# Patient Record
Sex: Male | Born: 1985
Health system: Southern US, Community
[De-identification: ages and names within clinical notes are randomized; demographics above are authoritative.]

## PROBLEM LIST (undated history)

## (undated) DIAGNOSIS — F32A Depression, unspecified: Secondary | ICD-10-CM

## (undated) DIAGNOSIS — F909 Attention-deficit hyperactivity disorder, unspecified type: Secondary | ICD-10-CM

## (undated) DIAGNOSIS — G43909 Migraine, unspecified, not intractable, without status migrainosus: Secondary | ICD-10-CM

## (undated) DIAGNOSIS — J9819 Other pulmonary collapse: Secondary | ICD-10-CM

## (undated) DIAGNOSIS — B019 Varicella without complication: Secondary | ICD-10-CM

## (undated) DIAGNOSIS — F329 Major depressive disorder, single episode, unspecified: Secondary | ICD-10-CM

## (undated) DIAGNOSIS — J050 Acute obstructive laryngitis [croup]: Secondary | ICD-10-CM

## (undated) DIAGNOSIS — F419 Anxiety disorder, unspecified: Secondary | ICD-10-CM

## (undated) HISTORY — DX: Other pulmonary collapse: J98.19

## (undated) HISTORY — DX: Varicella without complication: B01.9

---
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
## 2005-01-03 ENCOUNTER — Encounter: Admission: RE | Admit: 2005-01-03 | Discharge: 2005-01-03 | Payer: Self-pay | Admitting: Specialist

## 2006-04-18 ENCOUNTER — Encounter: Admission: RE | Admit: 2006-04-18 | Discharge: 2006-04-18 | Payer: Self-pay | Admitting: Specialist

## 2013-06-10 DIAGNOSIS — J9819 Other pulmonary collapse: Secondary | ICD-10-CM

## 2013-06-10 HISTORY — DX: Other pulmonary collapse: J98.19

## 2013-06-10 HISTORY — PX: CHEST TUBE INSERTION: SHX231

## 2013-07-16 ENCOUNTER — Encounter (HOSPITAL_COMMUNITY): Payer: Self-pay | Admitting: Emergency Medicine

## 2013-07-16 ENCOUNTER — Inpatient Hospital Stay (HOSPITAL_COMMUNITY)
Admission: EM | Admit: 2013-07-16 | Discharge: 2013-07-19 | DRG: 201 | Disposition: A | Discharge status: Home / Self Care | Payer: BC Managed Care – PPO | Attending: Cardiothoracic Surgery | Admitting: Cardiothoracic Surgery

## 2013-07-16 ENCOUNTER — Emergency Department (HOSPITAL_COMMUNITY): Payer: BC Managed Care – PPO

## 2013-07-16 DIAGNOSIS — J9383 Other pneumothorax: Secondary | ICD-10-CM

## 2013-07-16 DIAGNOSIS — F3289 Other specified depressive episodes: Secondary | ICD-10-CM | POA: Diagnosis present

## 2013-07-16 DIAGNOSIS — F909 Attention-deficit hyperactivity disorder, unspecified type: Secondary | ICD-10-CM | POA: Diagnosis present

## 2013-07-16 DIAGNOSIS — Z87891 Personal history of nicotine dependence: Secondary | ICD-10-CM

## 2013-07-16 DIAGNOSIS — F411 Generalized anxiety disorder: Secondary | ICD-10-CM | POA: Diagnosis present

## 2013-07-16 DIAGNOSIS — F329 Major depressive disorder, single episode, unspecified: Secondary | ICD-10-CM | POA: Diagnosis present

## 2013-07-16 HISTORY — DX: Major depressive disorder, single episode, unspecified: F32.9

## 2013-07-16 HISTORY — DX: Attention-deficit hyperactivity disorder, unspecified type: F90.9

## 2013-07-16 HISTORY — DX: Acute obstructive laryngitis (croup): J05.0

## 2013-07-16 HISTORY — DX: Migraine, unspecified, not intractable, without status migrainosus: G43.909

## 2013-07-16 HISTORY — DX: Depression, unspecified: F32.A

## 2013-07-16 HISTORY — DX: Anxiety disorder, unspecified: F41.9

## 2013-07-16 LAB — CBC WITH DIFFERENTIAL/PLATELET
BASOS PCT: 0 % (ref 0–1)
Basophils Absolute: 0 10*3/uL (ref 0.0–0.1)
Eosinophils Absolute: 0.1 10*3/uL (ref 0.0–0.7)
Eosinophils Relative: 1 % (ref 0–5)
HEMATOCRIT: 42 % (ref 39.0–52.0)
Hemoglobin: 15 g/dL (ref 13.0–17.0)
LYMPHS PCT: 21 % (ref 12–46)
Lymphs Abs: 2.1 10*3/uL (ref 0.7–4.0)
MCH: 29.8 pg (ref 26.0–34.0)
MCHC: 35.7 g/dL (ref 30.0–36.0)
MCV: 83.3 fL (ref 78.0–100.0)
MONO ABS: 0.8 10*3/uL (ref 0.1–1.0)
MONOS PCT: 8 % (ref 3–12)
Neutro Abs: 7 10*3/uL (ref 1.7–7.7)
Neutrophils Relative %: 71 % (ref 43–77)
Platelets: 341 10*3/uL (ref 150–400)
RBC: 5.04 MIL/uL (ref 4.22–5.81)
RDW: 12.5 % (ref 11.5–15.5)
WBC: 9.9 10*3/uL (ref 4.0–10.5)

## 2013-07-16 LAB — BASIC METABOLIC PANEL
BUN: 13 mg/dL (ref 6–23)
CALCIUM: 9.5 mg/dL (ref 8.4–10.5)
CHLORIDE: 106 meq/L (ref 96–112)
CO2: 18 meq/L — AB (ref 19–32)
CREATININE: 0.86 mg/dL (ref 0.50–1.35)
GFR calc Af Amer: >90 mL/min (ref >90)
GFR calc non Af Amer: >90 mL/min (ref >90)
Glucose, Bld: 111 mg/dL — ABNORMAL HIGH (ref 70–99)
Potassium: 3.5 mEq/L — ABNORMAL LOW (ref 3.7–5.3)
Sodium: 144 mEq/L (ref 137–147)

## 2013-07-16 MED ORDER — OXYCODONE HCL 5 MG PO TABS
5.0000 mg | ORAL_TABLET | ORAL | Status: DC | PRN
  Administered 2013-07-17 – 2013-07-19 (×5): 5 mg via ORAL
  Filled 2013-07-16 (×5): qty 1

## 2013-07-16 MED ORDER — DOCUSATE SODIUM 100 MG PO CAPS
100.0000 mg | ORAL_CAPSULE | Freq: Two times a day (BID) | ORAL | Status: DC
  Administered 2013-07-16 – 2013-07-19 (×6): 100 mg via ORAL
  Filled 2013-07-16 (×8): qty 1

## 2013-07-16 MED ORDER — ENOXAPARIN SODIUM 40 MG/0.4ML ~~LOC~~ SOLN
40.0000 mg | SUBCUTANEOUS | Status: DC
  Administered 2013-07-16 – 2013-07-18 (×3): 40 mg via SUBCUTANEOUS
  Filled 2013-07-16 (×4): qty 0.4

## 2013-07-16 MED ORDER — ALUM & MAG HYDROXIDE-SIMETH 200-200-20 MG/5ML PO SUSP: 30.0000 mL | Freq: Four times a day (QID) | ORAL | Status: DC | PRN

## 2013-07-16 MED ORDER — LIDOCAINE-EPINEPHRINE 2 %-1:100000 IJ SOLN
20.0000 mL | INTRAMUSCULAR | Status: AC
  Filled 2013-07-16: qty 20

## 2013-07-16 MED ORDER — SODIUM CHLORIDE 0.9 % IV SOLN
Freq: Once | INTRAVENOUS | Status: AC
  Administered 2013-07-16: 17:00:00 via INTRAVENOUS

## 2013-07-16 MED ORDER — SODIUM CHLORIDE 0.9 % IJ SOLN
3.0000 mL | Freq: Two times a day (BID) | INTRAMUSCULAR | Status: DC
  Administered 2013-07-16 – 2013-07-18 (×2): 3 mL via INTRAVENOUS

## 2013-07-16 MED ORDER — MIDAZOLAM HCL 2 MG/2ML IJ SOLN
4.0000 mg | Freq: Once | INTRAMUSCULAR | Status: AC
  Administered 2013-07-16: 4 mg via INTRAVENOUS
  Filled 2013-07-16: qty 4

## 2013-07-16 MED ORDER — POTASSIUM CHLORIDE IN NACL 20-0.45 MEQ/L-% IV SOLN
INTRAVENOUS | Status: DC
Start: 2013-07-16 — End: 2013-07-18
  Administered 2013-07-16 – 2013-07-17 (×2): via INTRAVENOUS
  Filled 2013-07-16 (×4): qty 1000

## 2013-07-16 MED ORDER — ACETAMINOPHEN 325 MG PO TABS
650.0000 mg | ORAL_TABLET | Freq: Four times a day (QID) | ORAL | Status: DC | PRN
  Administered 2013-07-17: 650 mg via ORAL
  Filled 2013-07-16: qty 2

## 2013-07-16 MED ORDER — FENTANYL CITRATE 0.05 MG/ML IJ SOLN
100.0000 ug | Freq: Once | INTRAMUSCULAR | Status: AC
  Administered 2013-07-16: 100 ug via INTRAVENOUS
  Filled 2013-07-16: qty 2

## 2013-07-16 MED ORDER — ACETAMINOPHEN 650 MG RE SUPP: 650.0000 mg | Freq: Four times a day (QID) | RECTAL | Status: DC | PRN

## 2013-07-16 MED ORDER — ONDANSETRON HCL 4 MG PO TABS
4.0000 mg | ORAL_TABLET | Freq: Four times a day (QID) | ORAL | Status: DC | PRN
  Administered 2013-07-17 – 2013-07-18 (×3): 4 mg via ORAL
  Filled 2013-07-16 (×3): qty 1

## 2013-07-16 MED ORDER — HYDROMORPHONE HCL PF 1 MG/ML IJ SOLN
1.0000 mg | INTRAMUSCULAR | Status: DC | PRN
  Administered 2013-07-16 – 2013-07-18 (×5): 1 mg via INTRAVENOUS
  Filled 2013-07-16 (×6): qty 1

## 2013-07-16 MED ORDER — ONDANSETRON HCL 4 MG/2ML IJ SOLN
4.0000 mg | Freq: Four times a day (QID) | INTRAMUSCULAR | Status: DC | PRN
  Administered 2013-07-17: 4 mg via INTRAVENOUS
  Filled 2013-07-16: qty 2

## 2013-07-16 MED ORDER — GUAIFENESIN-DM 100-10 MG/5ML PO SYRP: 5.0000 mL | ORAL_SOLUTION | ORAL | Status: DC | PRN

## 2013-07-16 NOTE — ED Notes (Signed)
Portable chest at bedside

## 2013-07-16 NOTE — Consult Note (Signed)
[address]       [place] [address]             [phone]        [facility] Medical Record #[id number] Date of Birth: January 10, 1986  Referring: No ref. provider found Primary Care: Aura Dials, MD  Chief Complaint:    Chief Complaint  Patient presents with  . Shortness of Breath   patient examined, chest radiographs reviewed  History of Present Illness:     28 year old Caucasian male reformed smoker presents with 36 hours of chest pain and shortness of breath. His primary physician ordered a chest x-ray which showed a 90% left pneumothorax, he wa s sent to the [facility]. Thoracic surgery was consulted.  The patient denies prior episode of spontaneous pneumothorax. He denies any recent traumatic episodes, violent coughing, or history of chronic lung disease or asthma. He is always been very thin.  After the ED evaluation I discussed left chest tube which was subsequently performed in the ED. The chest tube successfully reinflated the left lung and the patient is being admitted   Current Activity/ Functional Status: Works full time at [employer], lives with wife and children    Zubrod Score: At the time of surgery this patient's most appropriate activity status/level should be described as: []     0    Normal activity, no symptoms [x]     1    Restricted in physical strenuous activity but ambulatory, able to do out light work []     2    Ambulatory and capable of self care, unable to do work activities, up and about                 more than 50%  Of the time                            []     3    Only limited self care, in bed greater than 50% of waking hours []     4    Completely disabled, no self care, confined to bed or chair []     5    Moribund  Past Medical History  Diagnosis Date  . Depression   . Migraine   . Croup   . Anxiety   . ADHD (attention deficit hyperactivity disorder)     History reviewed. No pertinent past surgical  history.  History  Smoking status  . Former Smoker  Smokeless tobacco  . Not on file   History  Alcohol Use  . Yes    Comment: occasional    History   Social History  . Marital Status: Married    Spouse Name: N/A    Number of Children: N/A  . Years of Education: N/A   Occupational History  . Not on file.   Social History Main Topics  . Smoking status: Former Games developer  . Smokeless tobacco: Not on file  . Alcohol Use: Yes     Comment: occasional  . Drug Use: No     Comment: former user of drugs  . Sexual Activity: Not on file   Other Topics Concern  . Not on file   Social History Narrative  . No narrative on file    Allergies  Allergen Reactions  . Adderall [Amphetamine-Dextroamphetamine]     Current Facility-Administered Medications  Medication Dose Route Frequency Provider Last Rate Last Dose  .  0.45 % NaCl with KCl 20 mEq / L infusion   Intravenous Continuous Kerin PernaPeter Van Trigt, MD      . acetaminophen (TYLENOL) tablet 650 mg  650 mg Oral Q6H PRN Kerin PernaPeter Van Trigt, MD       Or  . acetaminophen (TYLENOL) suppository 650 mg  650 mg Rectal Q6H PRN Kerin PernaPeter Van Trigt, MD      . alum & mag hydroxide-simeth (MAALOX/MYLANTA) 200-200-20 MG/5ML suspension 30 mL  30 mL Oral Q6H PRN Kerin PernaPeter Van Trigt, MD      . docusate sodium (COLACE) capsule 100 mg  100 mg Oral BID Kerin PernaPeter Van Trigt, MD      . enoxaparin (LOVENOX) injection 40 mg  40 mg Subcutaneous Q24H Kerin PernaPeter Van Trigt, MD      . guaiFENesin-dextromethorphan [facility](ROBITUSSIN DM) 100-10 MG/5ML syrup 5 mL  5 mL Oral Q4H PRN Kerin PernaPeter Van Trigt, MD      . HYDROmorphone (DILAUDID) injection 1 mg  1 mg Intravenous Q2H PRN Kerin PernaPeter Van Trigt, MD      . lidocaine-EPINEPHrine (XYLOCAINE W/EPI) 2 %-1:100000 (with pres) injection 20 mL  20 mL Intradermal To ER Margie BilletMathias Allen, MD      . ondansetron [facility](ZOFRAN) tablet 4 mg  4 mg Oral Q6H PRN Kerin PernaPeter Van Trigt, MD       Or  . ondansetron [facility](ZOFRAN) injection 4 mg  4 mg Intravenous Q6H PRN Kerin PernaPeter Van Trigt, MD       . oxyCODONE (Oxy IR/ROXICODONE) immediate release tablet 5 mg  5 mg Oral Q3H PRN Kerin PernaPeter Van Trigt, MD      . sodium chloride 0.9 % injection 3 mL  3 mL Intravenous Q12H Kerin PernaPeter Van Trigt, MD       No current outpatient prescriptions on file.     (Not in a hospital admission)  History reviewed. No pertinent family history.   Review of Systems:    no prior spontaneous pneumothorax No history of chronic lung disease No preceding episodes of chest trauma or vigorous exercise Patient is right-hand dominant    Cardiac Review of Systems: Y or N  Chest Pain [ yes   ]  Resting SOB [  Y. ES  ] Exertional SOB  [ Y. ES ]  Orthopnea [ Y. ES  ]   Pedal Edema [  no  ]    Palpitations [ yes  ] Syncope  in[  ]   Presyncope [N.   ]  General Review of Systems: [Y] = yes [  ]=no Constitional: recent weight change [  ]; anorexia [  ]; fatigue [  ]; nausea [  ]; night sweats [  ]; fever [  ]; or chills [  ]                                                               Dental: poor dentition[  ]; Last Dentist visit:  one year   Eye : blurred vision [  ]; diplopia [   ]; vision changes [  ];  Amaurosis fugax[  ]; Resp: cough [  ];  wheezing[  ];  hemoptysis[  ]; shortness of breath[  ]; paroxysmal nocturnal dyspnea[  ]; dyspnea on exertion[  ]; or orthopnea[  ];  GI:  gallstones[  ],  vomiting[  ];  dysphagia[  ]; melena[  ];  hematochezia [  ]; heartburn[  ];   Hx of  Colonoscopy[  ]; GU: kidney stones [  ]; hematuria[  ];   dysuria [  ];  nocturia[  ];  history of     obstruction [  ]; urinary frequency [  ]             Skin: rash, swelling[  ];, hair loss[  ];  peripheral edema[  ];  or itching[  ]; Musculosketetal: myalgias[  ];  joint swelling[  ];  joint erythema[  ]; history left shoulder pain and left shoulder injection   joint pain[  ];  back pain[  ];  Heme/Lymph: bruising[  ];  bleeding[  ];  anemia[  ];  Neuro: TIA[  ];  headaches[  ];  stroke[  ];  vertigo[  ];  seizures[  ];   paresthesias[  ];   difficulty walking[  ];  Psych:depression[  ]; anxiety[ yes  ];  Endocrine: diabetes[  ];  thyroid dysfunction[  ];  Immunizations: Flu [  ]; Pneumococcal[  ];  Other:  Physical Exam: BP 120/88  Pulse 130  Temp(Src) 97.7 F (36.5 C) (Oral)  Resp 24  SpO2 96%  Exam    anxious young Caucasian male in the ED with mild respiratory distress on oxygen   HEENT normocephalic no crepitus in the neck good dentition Thorax -no deformity or tenderness, absent breath sounds on left breath sounds clear on right Cardiac-sinus tachycardia no murmur Abdomen-scaphoid nontender Extremities-no edema clubbing or tenderness no cyanosis Neuro-no focal more deficit   Diagnostic Studies & Laboratory data:    large left pneumothorax   Recent Radiology Findings:   Dg Chest Portable 1 View  07/16/2013   CLINICAL DATA:  Known large left pneumothorax  EXAM: PORTABLE CHEST - 1 VIEW  COMPARISON:  PA and lateral chest x-ray of today's date.  FINDINGS: There is a total pneumothorax on the left. Collapsed lung parenchyma seen in the infrahilar and lower paravertebral region. The right lung is well-expanded and clear. The cardiac silhouette is not enlarged. The pulmonary vascularity is not engorged. The observed portions of the bony thorax appear normal. There is no pleural effusion.  IMPRESSION: There is incomplete pneumothorax on the left.  These results were called by telephone at the time of interpretation on 07/16/2013 at 4:28 PM to Dr. Nelva Nay , who verbally acknowledged these results.   Electronically Signed   By: David  [place]   On: 07/16/2013 16:29      Recent Lab Findings: Lab Results  Component Value Date   WBC 9.9 07/16/2013   HGB 15.0 07/16/2013   HCT 42.0 07/16/2013   PLT 341 07/16/2013   GLUCOSE 111* 07/16/2013   NA 144 07/16/2013   K 3.5* 07/16/2013   CL 106 07/16/2013   CREATININE 0.86 07/16/2013   BUN 13 07/16/2013   CO2 18* 07/16/2013      Assessment / Plan:     Patient was prepared for and received  a left chest tube-20 [place], which reexpanded his left lung. Patient readmitted to 2 W. telemetry bed for chest tube suction management of his spontaneous pneumothorax.       @ME1 @ 07/16/2013 5:57 PM

## 2013-07-16 NOTE — ED Notes (Signed)
Per EMS: pt coughed last night and felt a pop in his chest. Pt then went to an urgent care this afternoon with chest tightness and they did a chest x-ray and said that his left lung was completely collapsed. Pt was 95% NRB, with some anxiety and given 4mg  of zofran for car sickness on the way over.

## 2013-07-16 NOTE — Op Note (Signed)
Procedure-left chest tube placement (20 [place]French) preop postoperative diagnosis-90% left pneumothorax, spontaneous  Surgeon Kathlee NationsPeter Van trigt M.D.  Anesthesia local 1% lidocaine with IV conscious sedation, monitored  Clinical note The patient is a thin 28 year old Caucasian male presented with 36 hours of chest pain and shortness of breath. Chest x-ray demonstrated a 90% left pneumothorax and he was having mild respiratory distress in the emergency department. Informed consent for left chest tube placement was obtained after discussing the indications benefits and risks with the patient and his wife.  Procedure A proper timeout was performed. The left chest was prepped and draped as a sterile field. 1% local lidocaine was infiltrated in the anterior axillary line at the fifth interspace A small incision was made, further lidocaine was infiltrated into the intercostal muscle A 20 French chest tube was then inserted and directed to the apex. A  blast of air exited the left chest with insertion of the tube. The tube was connected to a underwater seal Pleur-evac drainage system. The chest tube was secured to the skin with 2 silk sutures. Sterile dressings applied.  Subsequent chest x-ray showed the chest to be in good position with reexpansion of the left lung and with no airleak preparations for admission to the hospital are made.

## 2013-07-16 NOTE — ED Provider Notes (Signed)
CSN: [id number]     Arrival date & time 07/16/13  1546 History   First MD Initiated Contact with Patient 07/16/13 1553     Chief Complaint  Patient presents with  . Shortness of Breath    HPI: Sean Carroll is a 28 yo M with history of depression and migraines who presents with chest pain and SOB. Symptoms started last night, he felt as if he needed to cough, when he did he had sharp pain in the left side of his chest. He had mild SOB as well. Today his shortness of breath worsened, his chest felt tight so he went to his PCP, who sent him to urgent care where a chest x-ray showed PTX. He was sent to the ED via EMS. On arrival he complains of SOB and left sided chest pain, described as tightness, radiates to his left shoulder, worse with deep inspiration, no relieving factors. He is noted to be tachycardic with O2 sats of 95% on NRB. He has no history of PTX. Previous smoker but quite last year.    Past Medical History  Diagnosis Date  . Depression   . Migraine   . Croup    History reviewed. No pertinent past surgical history. History reviewed. No pertinent family history. History  Substance Use Topics  . Smoking status: Former Games developer  . Smokeless tobacco: Not on file  . Alcohol Use: Yes     Comment: occasional    Review of Systems  Constitutional: Negative for fever, chills, appetite change and fatigue.  Eyes: Negative for photophobia and visual disturbance.  Respiratory: Positive for cough and shortness of breath.   Cardiovascular: Positive for chest pain.  Gastrointestinal: Positive for nausea (in the ambulance, resolved in ED). Negative for vomiting, abdominal pain and constipation.  Genitourinary: Negative for dysuria, frequency and decreased urine volume.  Musculoskeletal: Negative for arthralgias, back pain, gait problem and myalgias.  Skin: Negative for color change and wound.  Neurological: Negative for dizziness, syncope, light-headedness and headaches.   Psychiatric/Behavioral: Negative for confusion and agitation.  All other systems reviewed and are negative.    Allergies  Adderall  Home Medications  No current outpatient prescriptions on file. BP 116/91  Temp(Src) 97.7 F (36.5 C) (Oral)  Resp 13  SpO2 96% Physical Exam  Nursing note and vitals reviewed. Constitutional: He is oriented to person, place, and time. He appears well-developed and well-nourished. No distress.  HENT:  Head: Normocephalic and atraumatic.  Mouth/Throat: Oropharynx is clear and moist.  Eyes: Conjunctivae and EOM are normal. Pupils are equal, round, and reactive to light.  Neck: Normal range of motion. Neck supple.  Cardiovascular: Regular rhythm, normal heart sounds and intact distal pulses.  Tachycardia present.   Pulmonary/Chest: Tachypnea noted. No respiratory distress. He has decreased breath sounds (over majority of left lung).  Abdominal: Soft. Bowel sounds are normal. There is no tenderness. There is no rebound and no guarding.  Musculoskeletal: Normal range of motion. He exhibits no edema and no tenderness.  Neurological: He is alert and oriented to person, place, and time. No cranial nerve deficit. Coordination normal.  Skin: Skin is warm and dry. No rash noted.  Psychiatric: His mood appears anxious.    ED Course  Procedures (including critical care time) Labs Review Labs Reviewed  BASIC METABOLIC PANEL - Abnormal; Notable for the following:    Potassium 3.5 (*)    CO2 18 (*)    Glucose, Bld 111 (*)    All other components within normal  limits  CBC WITH DIFFERENTIAL  COMPREHENSIVE METABOLIC PANEL  CBC   Imaging Review Dg Chest Port 1 View  07/16/2013   CLINICAL DATA:  Chest tube insertion for a large left pneumothorax.  EXAM: PORTABLE CHEST - 1 VIEW  COMPARISON:  Earlier today.  FINDINGS: Interval left chest tube with its tip at the left lung apex. The previously seen large left pneumothorax has resolved. Minimal linear density in  the left upper lobe. Clear right lung. Normal sized heart. Unremarkable bones.  IMPRESSION: Resolved large left pneumothorax following chest tube placement.   Electronically Signed   By: Gordan PaymentSteve  Reid M.D.   On: 07/16/2013 17:55   Dg Chest Portable 1 View  07/16/2013   CLINICAL DATA:  Known large left pneumothorax  EXAM: PORTABLE CHEST - 1 VIEW  COMPARISON:  PA and lateral chest x-ray of today's date.  FINDINGS: There is a total pneumothorax on the left. Collapsed lung parenchyma seen in the infrahilar and lower paravertebral region. The right lung is well-expanded and clear. The cardiac silhouette is not enlarged. The pulmonary vascularity is not engorged. The observed portions of the bony thorax appear normal. There is no pleural effusion.  IMPRESSION: There is incomplete pneumothorax on the left.  These results were called by telephone at the time of interpretation on 07/16/2013 at 4:28 PM to Dr. Nelva NayOBERT BEATON , who verbally acknowledged these results.   Electronically Signed   By: David  [place]   On: 07/16/2013 16:29    EKG Interpretation   Time and Date: 06/15/2012 1557 Rate: 126 PR: 126 QRS: 76 QT: 334 QTc: 484 R: 76 Interpretation:  Sinus tachycardia with prolonged QTc, likely rate related. Poor R wave progression. No previous ECG for comparison      MDM    28 year old male with no pertinent history who presents for further evaluation of spontaneous pneumothorax that was diagnosed at an urgent care center just prior to arrival.  He is hypoxic, oxygen saturation's 95% on NRB, also noted to be tachycardic into the 130s. ECG show sinus rhythm without ischemic changes. Repeat chest x-ray shows complete left-sided pneumothorax. His trachea is midline and there is no mediastinal shift to suggest tension. CT surgery was consulted and they placed a left-sided chest tube. Repeat chest x-ray shows resolution of the large pneumothorax. His symptoms markedly improved following chest tube insertion. He was  maintained on nasal cannula. Following chest tube insertion he was admitted to the cardiothoracic surgery service. He remained HD stable. The patient and his family were updated on the plan and they were in agreement  Reviewed imaging, labs, ECG and previous medical records, utilized in make decision-making  Discuss case with Dr. Radford PaxBeaton  Clinical impression 1. Left-sided pneumothorax     Sean BilletMathias Britney Newstrom, MD 07/17/13 [phone]

## 2013-07-16 NOTE — ED Notes (Signed)
Pt remains alert and talkative throughout the procedure. Pt cooperative and following commands.

## 2013-07-16 NOTE — ED Notes (Signed)
Cardiothoracic MD at bedside discussing plan of care and getting patient consent.

## 2013-07-17 ENCOUNTER — Inpatient Hospital Stay (HOSPITAL_COMMUNITY): Payer: BC Managed Care – PPO

## 2013-07-17 LAB — COMPREHENSIVE METABOLIC PANEL
ALT: 12 U/L (ref 0–53)
AST: 16 U/L (ref 0–37)
Albumin: 3.6 g/dL (ref 3.5–5.2)
Alkaline Phosphatase: 32 U/L — ABNORMAL LOW (ref 39–117)
BUN: 11 mg/dL (ref 6–23)
CO2: 25 mEq/L (ref 19–32)
Calcium: 8.8 mg/dL (ref 8.4–10.5)
Chloride: 107 mEq/L (ref 96–112)
Creatinine, Ser: 0.93 mg/dL (ref 0.50–1.35)
GFR calc Af Amer: >90 mL/min (ref >90)
GFR calc non Af Amer: >90 mL/min (ref >90)
Glucose, Bld: 95 mg/dL (ref 70–99)
Potassium: 4.4 mEq/L (ref 3.7–5.3)
Sodium: 143 mEq/L (ref 137–147)
Total Bilirubin: 2.1 mg/dL — ABNORMAL HIGH (ref 0.3–1.2)
Total Protein: 6.5 g/dL (ref 6.0–8.3)

## 2013-07-17 LAB — CBC
HCT: 38.7 % — ABNORMAL LOW (ref 39.0–52.0)
Hemoglobin: 13.5 g/dL (ref 13.0–17.0)
MCH: 29.7 pg (ref 26.0–34.0)
MCHC: 34.9 g/dL (ref 30.0–36.0)
MCV: 85.2 fL (ref 78.0–100.0)
Platelets: 298 10*3/uL (ref 150–400)
RBC: 4.54 MIL/uL (ref 4.22–5.81)
RDW: 12.5 % (ref 11.5–15.5)
WBC: 16 10*3/uL — ABNORMAL HIGH (ref 4.0–10.5)

## 2013-07-17 MED ORDER — TRAMADOL HCL 50 MG PO TABS
50.0000 mg | ORAL_TABLET | Freq: Four times a day (QID) | ORAL | Status: DC | PRN
  Administered 2013-07-17 – 2013-07-18 (×2): 50 mg via ORAL
  Administered 2013-07-19: 100 mg via ORAL
  Filled 2013-07-17: qty 1
  Filled 2013-07-17: qty 2
  Filled 2013-07-17: qty 1

## 2013-07-17 MED ORDER — PROMETHAZINE HCL 25 MG/ML IJ SOLN
6.2500 mg | INTRAMUSCULAR | Status: DC | PRN
  Administered 2013-07-17: 6.25 mg via INTRAVENOUS
  Filled 2013-07-17: qty 1

## 2013-07-17 MED ORDER — KETOROLAC TROMETHAMINE 15 MG/ML IJ SOLN
15.0000 mg | Freq: Four times a day (QID) | INTRAMUSCULAR | Status: AC
  Administered 2013-07-17 – 2013-07-18 (×6): 15 mg via INTRAVENOUS
  Filled 2013-07-17 (×7): qty 1

## 2013-07-17 NOTE — ED Provider Notes (Signed)
I saw and evaluated the patient, reviewed the resident's note and I agree with the findings and plan.   .Face to face Exam:  General:  Awake HEENT:  Atraumatic Resp:  Normal effort Abd:  Nondistended Neuro:No focal weakness   Reviewed EKG with resident agree with findings  Nelia Shiobert L Keshav Winegar, MD 07/17/13 1650

## 2013-07-17 NOTE — Progress Notes (Addendum)
       [address]       [place] [id number]             [phone]               Subjective: Complaining of sharp pains with inspiration, nausea from pain meds.   Objective: Vital signs in last 24 hours: Patient Vitals for the past 24 hrs:  BP Temp Temp src Pulse Resp SpO2 Height Weight  07/17/13 0413 106/64 mmHg 98.5 F (36.9 C) Oral 95 19 97 % - -  07/16/13 1910 124/81 mmHg 98.3 F (36.8 C) Oral 98 18 100 % 5\' 10"  (1.778 m) 132 lb 15 oz (60.3 kg)  07/16/13 1848 121/84 mmHg 98.6 F (37 C) Oral - 22 99 % - -  07/16/13 1845 119/76 mmHg - - 102 - 98 % - -  07/16/13 1645 120/88 mmHg - - 130 - 96 % - -  07/16/13 1630 131/78 mmHg - - 106 24 94 % - -  07/16/13 1606 116/91 mmHg 97.7 F (36.5 C) Oral - 13 96 % - -  07/16/13 1600 110/73 mmHg - - 136 26 95 % - -   Current Weight  07/16/13 132 lb 15 oz (60.3 kg)     Intake/Output from previous day: 02/06 0701 - 02/07 0700 In: 546.7 [I.V.:546.7] Out: -     PHYSICAL EXAM:  Heart: RRR Lungs: Good bilateral BS Chest tube: No air leak    Lab Results: CBC: Recent Labs  07/16/13 1559 07/17/13 0405  WBC 9.9 16.0*  HGB 15.0 13.5  HCT 42.0 38.7*  PLT 341 298   BMET:  Recent Labs  07/16/13 1559 07/17/13 0405  NA 144 143  K 3.5* 4.4  CL 106 107  CO2 18* 25  GLUCOSE 111* 95  BUN 13 11  CREATININE 0.86 0.93  CALCIUM 9.5 8.8    PT/INR: No results found for this basename: LABPROT, INR,  in the last 72 hours  CXR: FINDINGS:  Cardiac shadow is stable. The lungs remain well aerated without  definitive pneumothorax. A left chest tube remains in place. No bony  abnormality is seen.  IMPRESSION:  No acute abnormality noted. Chest tube in satisfactory position  without persistent pneumothorax.    Assessment/Plan: R spontaneous ptx-  Lung re-expanded on CXR, no air leak in CT.  CT presently on water seal rather than suction as ordered.  Since he has no leak and ptx is improved, will leave CT  to water seal for now. Pain control- Since Cr is stable, will add scheduled Toradol for a few doses to see if he can get some relief.    LOS: 1 day    COLLINS,GINA H 07/17/2013  I have seen and examined the patient and agree with the assessment and plan as outlined.  OWEN,CLARENCE H 07/17/2013 11:46 AM

## 2013-07-18 ENCOUNTER — Inpatient Hospital Stay (HOSPITAL_COMMUNITY): Payer: BC Managed Care – PPO

## 2013-07-18 NOTE — Progress Notes (Signed)
Utilization Review Completed.Katja Blue T2/01/2014  

## 2013-07-18 NOTE — Progress Notes (Addendum)
       [address]       [place] [id number]             [phone]               Subjective: Pain better controlled today, nausea resolved.  Breathing stable.   Objective: Vital signs in last 24 hours: Patient Vitals for the past 24 hrs:  BP Temp Temp src Pulse Resp SpO2  07/18/13 0410 100/66 mmHg 98.4 F (36.9 C) Oral 81 20 97 %  07/17/13 1941 104/66 mmHg 98.9 F (37.2 C) Oral 97 18 97 %  07/17/13 1345 100/62 mmHg 97.7 F (36.5 C) Oral 65 20 97 %   Current Weight  07/16/13 132 lb 15 oz (60.3 kg)     Intake/Output from previous day: 02/07 0701 - 02/08 0700 In: 1630 [P.O.:480; I.V.:1150] Out: -     PHYSICAL EXAM:  Heart: RRR Lungs: Clear Chest tube: No air leak    Lab Results: CBC: Recent Labs  07/16/13 1559 07/17/13 0405  WBC 9.9 16.0*  HGB 15.0 13.5  HCT 42.0 38.7*  PLT 341 298   BMET:  Recent Labs  07/16/13 1559 07/17/13 0405  NA 144 143  K 3.5* 4.4  CL 106 107  CO2 18* 25  GLUCOSE 111* 95  BUN 13 11  CREATININE 0.86 0.93  CALCIUM 9.5 8.8    PT/INR: No results found for this basename: LABPROT, INR,  in the last 72 hours  CXR: FINDINGS:  Cardiac shadow is stable. The lungs remain well aerated without  definitive pneumothorax. A left chest tube remains in place. No bony  abnormality is seen.  IMPRESSION:  No acute abnormality noted. Chest tube in satisfactory position  without persistent pneumothorax.   Assessment/Plan: Spontaneous ptx- No ptx on CXR and no air leak on exam. Hopefully can d/c CT today. Repeat CXR tomorrow- possibly home in am if CXR stable.   LOS: 2 days    COLLINS,GINA H 07/18/2013  I have seen and examined the patient and agree with the assessment and plan as outlined.  D/C chest tube.  Possible d/c home tomorrow.  Patches Mcdonnell H 07/18/2013 11:42 AM

## 2013-07-18 NOTE — Progress Notes (Signed)
Chest tube removed per order and protocol, chest tube intact upon removal, dressed with Vaseline gauze, dry gauze and tape, sutures tied and secured, pt tolerated well, will continue to monitor, pt advised to make RN aware of any shortness of breath and pt stated understanding Archie BalboaStein, Jaiveer Panas G, RN

## 2013-07-18 NOTE — Discharge Summary (Signed)
[address]       [place] [address]             [phone]              Discharge Summary  Name: Sean Carroll DOB: July 11, 1985 28 y.o. MRN: [id number]   Admission Date: 07/16/2013 Discharge Date:     Admitting Diagnosis: Spontaneous left pneumothorax   Discharge Diagnosis:  Spontaneous left pneumothorax  Past Medical History  Diagnosis Date  . Depression   . Migraine   . Croup   . Anxiety   . ADHD (attention deficit hyperactivity disorder)      Procedures: Placement of left chest tube- 07/16/2013   HPI:  The patient is a 28 y.o. male who presented to the ER at [facility] on the date of admission complaining of shortness of breath.  On the evening prior to admission, he felt as though he had to cough, then became dyspneic, with a sharp pain in his left chest.  His symptoms continued to worsen, and he sought care from his primary MD.  He was referred to Urgent Care, where a chest x-ray showed a complete left pneumothorax.  He was brought to the ER by EMS and was noted to be tachycardic and tachypneic.  Thoracic surgery was consulted.  Dr. Donata Clay saw the patient, and a left chest tube was placed at the bedside.  He was admitted for chest tube management.    Hospital Course:  The patient was admitted to [facility] on 07/16/2013. Follow up chest x-rays have shown complete re-expansion of the lung.  His chest tube has been managed conservatively and placed on water seal. No air leak has been noted.  The chest tube was removed without difficulty, and subsequent x-rays revealed re-development of pneumothorax.  Repeat x-ray was obtained the morning of 07/19/2013 and showed the pneumothorax remained stable in size.  He has otherwise had no issues during this admission. Pain has been controlled with oral pain medications.  Oxygen sats have been greater than 90% on room air.  He is ambulating in the halls and tolerating a diet.  We anticipate discharge home  in the next 24-48 hours.    Recent vital signs:  Filed Vitals:   07/19/13 0410  BP: 111/72  Pulse: 77  Temp: 97.6 F (36.4 C)  Resp: 18    Recent laboratory studies:  CBC:  Recent Labs  07/16/13 1559 07/17/13 0405  WBC 9.9 16.0*  HGB 15.0 13.5  HCT 42.0 38.7*  PLT 341 298   BMET:   Recent Labs  07/16/13 1559 07/17/13 0405  NA 144 143  K 3.5* 4.4  CL 106 107  CO2 18* 25  GLUCOSE 111* 95  BUN 13 11  CREATININE 0.86 0.93  CALCIUM 9.5 8.8    PT/INR: No results found for this basename: LABPROT, INR,  in the last 72 hours   Discharge Medications:      Medication List         ADVIL COLD & SINUS LIQUI-GELS 30-200 MG Caps  Generic drug:  Pseudoephedrine-Ibuprofen  Take 2 tablets by mouth every 6 (six) hours as needed (for sinus/headache).     calcium carbonate 500 MG chewable tablet  Commonly known as:  TUMS - dosed in mg elemental calcium  Chew 1-2 tablets by mouth 2 (two) times daily as needed for indigestion or heartburn.     oxyCODONE 5 MG immediate release tablet  Commonly  known as:  Oxy IR/ROXICODONE  Take 1 tablet (5 mg total) by mouth every 3 (three) hours as needed for moderate pain.          Discharge Instructions:  The patient is to refrain from driving, heavy lifting or strenuous activity.  May shower daily and clean incisions with soap and water.  May resume regular diet.   Follow Up:    Follow-up Information   Follow up with VAN Dinah BeersRIGT III,PETER, MD In 1 week. (Office will call with an appointment)    Specialty:  Cardiothoracic Surgery   Contact information:   [address] [place] [address] [phone]       Follow up with [facility] In 1 week. (Please get CXR prior to appointment with Dr. Donata ClayVan Trigt)    Contact information:   [place]        Follow-up Information   Follow up with VAN Dinah BeersRIGT III,PETER, MD In 1 week. (Office will call with an appointment)    Specialty:  Cardiothoracic Surgery   Contact  information:   [address] [place] [address] [phone]       Follow up with [facility] In 1 week. (Please get CXR prior to appointment with Dr. Donata ClayVan Trigt)    Contact information:   [place]        Era Parr 07/19/2013, 10:38 AM

## 2013-07-19 ENCOUNTER — Inpatient Hospital Stay (HOSPITAL_COMMUNITY): Payer: BC Managed Care – PPO

## 2013-07-19 MED ORDER — OXYCODONE HCL 5 MG PO TABS: 5.0000 mg | ORAL_TABLET | ORAL | Status: DC | PRN

## 2013-07-19 NOTE — Discharge Instructions (Signed)
Pneumothorax °A pneumothorax, commonly called a collapsed lung, is a condition in which air leaks from a lung and builds up in the space between the lung and the chest wall (pleural space). The air in a pneumothorax is trapped outside the lung and takes up space, preventing the lung from fully expanding. This is a condition that usually occurs suddenly. The buildup of air may be small or large. A small pneumothorax may go away on its own. When a pneumothorax is larger, it will often require medical treatment and hospitalization.  °CAUSES  °A pneumothorax can sometimes happen quickly with no apparent cause. People with underlying lung problems, particularly COPD or emphysema, are at higher risk of pneumothorax. However, pneumothorax can happen quickly even in people with no prior known lung problems. Trauma, surgery, medical procedures, or injury to the chest wall can also cause a pneumothorax. °SIGNS AND SYMPTOMS  °Sometimes a pneumothorax will have no symptoms. When symptoms are present, they can include: °· Chest pain. °· Shortness of breath. °· Increased rate of breathing. °· Bluish color to your lips or skin (cyanosis). °DIAGNOSIS  °Pneumothorax is usually diagnosed by a chest X-ray or chest CT scan. Your health care provider will also take a medical history and perform a physical exam to determine why you may have a pneumothorax. °TREATMENT  °A small pneumothorax may go away on its own without treatment. Extra oxygen can sometimes help a small pneumothorax go away more quickly. For a larger pneumothorax or a pneumothorax that is causing symptoms, a procedure is usually needed to drain the air. In some cases, the health care provider may drain the air using a needle. In other cases, a chest tube may be inserted into the pleural space. A chest tube is a small tube placed between the ribs and into the pleural space. This removes the extra air and allows the lung to expand back to its normal size. A large  pneumothorax will usually require a hospital stay. If there is ongoing air leakage into the pleural space, then the chest tube may need to remain in place for several days until the air leak has healed. In some cases, surgery may be needed.  °HOME CARE INSTRUCTIONS  °· Only take over-the-counter or prescription medicines as directed by your health care provider. °· If a cough or pain makes it difficult for you to sleep at night, try sleeping in a semi-upright position in a recliner or by using 2 or 3 pillows. °· Rest and limit activity as directed by your health care provider. °· If you had a chest tube and it was removed, ask your health care provider when it is okay to remove the dressing. Until your health care provider says you can remove the dressing, do not allow it to get wet. °· Do not smoke. Smoking is a risk factor for pneumothorax. °· Do not fly in an airplane or scuba dive until your health care provider says it is okay. °· Follow up with your health care provider as directed. °SEEK IMMEDIATE MEDICAL CARE IF:  °· You have increasing chest pain or shortness of breath. °· You have a cough that is not controlled with suppressants. °· You begin coughing up blood. °· You have pain that is getting worse or is not controlled with medicines. °· You cough up thick, discolored mucus (sputum) that is yellow to green in color. °· You have redness, increasing pain, or discharge at the site where a chest tube had been in place (if   your pneumothorax was treated with a chest tube). °· The site where your chest tube was located opens up. °· You feel air coming out of the site where the chest tube was placed. °· You have a fever or persistent symptoms for more than 2 3 days. °· You have a fever and your symptoms suddenly get worse. °MAKE SURE YOU:  °· Understand these instructions. °· Will watch your condition. °· Will get help right away if you are not doing well or get worse. °Document Released: 05/27/2005 Document  Revised: 03/17/2013 Document Reviewed: 12/24/2012 °ExitCare® Patient Information ©2014 [employer]. ° °

## 2013-07-19 NOTE — Progress Notes (Addendum)
      [address]       [place] [id number]             [phone]      Subjective:  [name] is without complaints this morning.  Post chest tube removal yesterday, he did develop a small pneumothorax.   Objective: Vital signs in last 24 hours: Temp:  [97.6 F (36.4 C)-98.4 F (36.9 C)] 97.6 F (36.4 C) (02/09 0410) Pulse Rate:  [77-116] 77 (02/09 0410) Cardiac Rhythm:  [-] Normal sinus rhythm (02/08 1952) Resp:  [18-20] 18 (02/09 0410) BP: (104-127)/(70-72) 111/72 mmHg (02/09 0410) SpO2:  [94 %-100 %] 96 % (02/09 0410)  Intake/Output from previous day: 02/08 0701 - 02/09 0700 In: 630 [P.O.:480; I.V.:150] Out: -   General appearance: alert, cooperative and no distress Heart: regular rate and rhythm Lungs: clear to auscultation bilaterally Abdomen: soft, non-tender; bowel sounds normal; no masses,  no organomegaly Extremities: extremities normal, atraumatic, no cyanosis or edema Wound: clean and dry  Lab Results:  Recent Labs  07/16/13 1559 07/17/13 0405  WBC 9.9 16.0*  HGB 15.0 13.5  HCT 42.0 38.7*  PLT 341 298   BMET:  Recent Labs  07/16/13 1559 07/17/13 0405  NA 144 143  K 3.5* 4.4  CL 106 107  CO2 18* 25  GLUCOSE 111* 95  BUN 13 11  CREATININE 0.86 0.93  CALCIUM 9.5 8.8    PT/INR: No results found for this basename: LABPROT, INR,  in the last 72 hours ABG No results found for this basename: phart, pco2, po2, hco3, tco2, acidbasedef, o2sat   CBG (last 3)  No results found for this basename: GLUCAP,  in the last 72 hours  Assessment/Plan:  1. Pneumothorax- on left, has remained stable overnight, patient with minimal symptoms 2. Dispo- will have surgeon review CXR to decide d/c home today vs tomorrow   LOS: 3 days    Sean Carroll, Sean 07/19/2013   Chart reviewed, patient examined, agree with above. His CXR is stable. The small left apical ptx has not changed since tube removed and it was probably due to some air getting  sucked in as tube was removed. He is fairly comfortable so I think he can go home and should return for CXR in 1 week and chest tube suture can be removed at that time.

## 2013-07-22 NOTE — Discharge Summary (Signed)
patient examined and medical record reviewed,agree with above note. VAN TRIGT III,Shameeka Silliman 07/22/2013

## 2013-07-23 ENCOUNTER — Other Ambulatory Visit: Payer: Self-pay | Admitting: *Deleted

## 2013-07-23 DIAGNOSIS — J939 Pneumothorax, unspecified: Secondary | ICD-10-CM

## 2013-07-28 ENCOUNTER — Ambulatory Visit: Payer: BC Managed Care – PPO | Admitting: Cardiothoracic Surgery

## 2013-07-29 ENCOUNTER — Ambulatory Visit
Admission: RE | Admit: 2013-07-29 | Discharge: 2013-07-29 | Disposition: A | Discharge status: Home / Self Care | Payer: BC Managed Care – PPO | Source: Ambulatory Visit | Attending: Cardiothoracic Surgery | Admitting: Cardiothoracic Surgery

## 2013-07-29 ENCOUNTER — Ambulatory Visit (INDEPENDENT_AMBULATORY_CARE_PROVIDER_SITE_OTHER): Payer: Self-pay | Admitting: Physician Assistant

## 2013-07-29 VITALS — BP 108/71 | HR 100 | Resp 20 | Ht 70.0 in | Wt 132.0 lb

## 2013-07-29 DIAGNOSIS — J939 Pneumothorax, unspecified: Secondary | ICD-10-CM

## 2013-07-29 DIAGNOSIS — J9383 Other pneumothorax: Secondary | ICD-10-CM

## 2013-07-29 NOTE — Progress Notes (Signed)
       [address]       [place] [id number]             [phone]          HPI: Patient returns for routine followup.  He was recently admitted at [facility] with a spontaneous left pneumothorax.  A chest tube was placed on 2/6 and he was admitted for chest tube management.  The tube was discontinued on 2/8, and the patient was discharged home on 2/9.  At the time of discharge, his chest x-ray showed a small left apical pneumothorax, which was stable from the film take immediately following chest tube removal.   Since discharge, he has complained of soreness at the chest tube site, but is not taking pain meds on a regular basis.  Denies shortness of breath except with increased exertion.    Current Outpatient Prescriptions  Medication Sig Dispense Refill  . albuterol (PROVENTIL HFA;VENTOLIN HFA) 108 (90 BASE) MCG/ACT inhaler Inhale 2 puffs into the lungs every 6 (six) hours as needed for wheezing or shortness of breath.      . calcium carbonate (TUMS - DOSED IN MG ELEMENTAL CALCIUM) 500 MG chewable tablet Chew 1-2 tablets by mouth 2 (two) times daily as needed for indigestion or heartburn.      Marland Kitchen. oxyCODONE (OXY IR/ROXICODONE) 5 MG immediate release tablet Take 1 tablet (5 mg total) by mouth every 3 (three) hours as needed for moderate pain.  30 tablet  0  . Pseudoephedrine-Ibuprofen (ADVIL COLD & SINUS LIQUI-GELS) 30-200 MG CAPS Take 2 tablets by mouth every 6 (six) hours as needed (for sinus/headache).       No current facility-administered medications for this visit.     Physical Exam: BP 108/71 HR 100 Resp 20 Wounds: Chest tube site is clean and dry, and suture is removed without difficulty. Heart: Regular rate and rhythm Lungs: Clear to auscultation   Diagnostic Tests: Chest xray: Dg Chest 2 View  07/29/2013   CLINICAL DATA:  History of pneumothorax, follow-up,  EXAM: CHEST  2 VIEW  COMPARISON:  DG CHEST 2 VIEW dated 07/19/2013  FINDINGS: The left apical  pneumothorax has resolved. There is no pneumomediastinum. Both lungs are well-expanded. There is no pleural effusion. The heart and mediastinal structures are within the limits of normal. No acute bony abnormality is demonstrated.  IMPRESSION: There has been interval resolution of the left apical pneumothorax.   Electronically Signed   By: David  [place]az[place]   On: 07/29/2013 10:42       Assessment/Plan: The patient is doing well and his left pneumothorax has now completely resolved.  He still has some discomfort around the chest tube site, but this sounds inflammatory for the most part. I have recommended Ibuprofen for this pain, up to 800 mg three times daily as needed.  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  I explained to call if he experiences any worsening chest pain or shortness of breath so we can monitor for recurrence.  We will see him back prn.

## 2013-08-04 ENCOUNTER — Ambulatory Visit: Payer: BC Managed Care – PPO | Admitting: Cardiothoracic Surgery

## 2013-08-15 ENCOUNTER — Telehealth: Payer: Self-pay | Admitting: Thoracic Surgery (Cardiothoracic Vascular Surgery)

## 2013-08-15 ENCOUNTER — Encounter (HOSPITAL_COMMUNITY): Payer: Self-pay | Admitting: Emergency Medicine

## 2013-08-15 ENCOUNTER — Emergency Department (HOSPITAL_COMMUNITY)
Admission: EM | Admit: 2013-08-15 | Discharge: 2013-08-15 | Disposition: A | Discharge status: Home / Self Care | Payer: BC Managed Care – PPO | Attending: Emergency Medicine | Admitting: Emergency Medicine

## 2013-08-15 ENCOUNTER — Emergency Department (HOSPITAL_COMMUNITY): Payer: BC Managed Care – PPO

## 2013-08-15 DIAGNOSIS — Z8709 Personal history of other diseases of the respiratory system: Secondary | ICD-10-CM | POA: Insufficient documentation

## 2013-08-15 DIAGNOSIS — R0789 Other chest pain: Secondary | ICD-10-CM

## 2013-08-15 DIAGNOSIS — Z8659 Personal history of other mental and behavioral disorders: Secondary | ICD-10-CM | POA: Insufficient documentation

## 2013-08-15 DIAGNOSIS — Z87891 Personal history of nicotine dependence: Secondary | ICD-10-CM | POA: Insufficient documentation

## 2013-08-15 DIAGNOSIS — Z8679 Personal history of other diseases of the circulatory system: Secondary | ICD-10-CM | POA: Insufficient documentation

## 2013-08-15 LAB — BASIC METABOLIC PANEL
BUN: 16 mg/dL (ref 6–23)
CO2: 20 meq/L (ref 19–32)
Calcium: 10.2 mg/dL (ref 8.4–10.5)
Chloride: 105 mEq/L (ref 96–112)
Creatinine, Ser: 0.86 mg/dL (ref 0.50–1.35)
GFR calc Af Amer: >90 mL/min (ref >90)
GFR calc non Af Amer: >90 mL/min (ref >90)
GLUCOSE: 98 mg/dL (ref 70–99)
POTASSIUM: 3.4 meq/L — AB (ref 3.7–5.3)
SODIUM: 140 meq/L (ref 137–147)

## 2013-08-15 LAB — CBC
HEMATOCRIT: 41.9 % (ref 39.0–52.0)
HEMOGLOBIN: 15.2 g/dL (ref 13.0–17.0)
MCH: 30.2 pg (ref 26.0–34.0)
MCHC: 36.3 g/dL — ABNORMAL HIGH (ref 30.0–36.0)
MCV: 83.3 fL (ref 78.0–100.0)
Platelets: 291 10*3/uL (ref 150–400)
RBC: 5.03 MIL/uL (ref 4.22–5.81)
RDW: 12.4 % (ref 11.5–15.5)
WBC: 7.6 10*3/uL (ref 4.0–10.5)

## 2013-08-15 LAB — PRO B NATRIURETIC PEPTIDE: Pro B Natriuretic peptide (BNP): 12.1 pg/mL (ref 0–125)

## 2013-08-15 LAB — I-STAT TROPONIN, ED: Troponin i, poc: 0 ng/mL (ref 0.00–0.08)

## 2013-08-15 MED ORDER — HYDROCODONE-ACETAMINOPHEN 5-325 MG PO TABS: 1.0000 | ORAL_TABLET | Freq: Four times a day (QID) | ORAL | Status: DC | PRN

## 2013-08-15 MED ORDER — HYDROCODONE-ACETAMINOPHEN 5-325 MG PO TABS
2.0000 | ORAL_TABLET | Freq: Once | ORAL | Status: AC
  Administered 2013-08-15: 2 via ORAL
  Filled 2013-08-15: qty 2

## 2013-08-15 MED ORDER — PREDNISONE 50 MG PO TABS: ORAL_TABLET | ORAL | Status: DC

## 2013-08-15 NOTE — Telephone Encounter (Signed)
Patient's wife called to state that the patient was having chest pain and SOB similar to symptoms that he had at the time of his recent spontaneous pneumothorax.    I advised them to go to the Emergency Department to be evaluated and have repeat CXR done.  Letticia Bhattacharyya H 08/15/2013 1:32 PM

## 2013-08-15 NOTE — ED Provider Notes (Addendum)
CSN: [id number]     Arrival date & time 08/15/13  1406 History   First MD Initiated Contact with Patient 08/15/13 1425     Chief Complaint  Patient presents with  . Shortness of Breath     (Consider location/radiation/quality/duration/timing/severity/associated sxs/prior Treatment) Patient is a 28 y.o. male presenting with shortness of breath. The history is provided by the patient. No language interpreter was used.  Shortness of Breath Severity:  Moderate Onset quality:  Sudden Duration:  1 day Timing:  Constant Progression:  Unchanged Chronicity:  New Context comment:  At work Relieved by:  Nothing Exacerbated by: leaning back. Ineffective treatments:  None tried Associated symptoms: chest pain   Associated symptoms: no abdominal pain, no claudication, no cough, no diaphoresis, no fever, no headaches, no rash, no sore throat, no sputum production and no vomiting   Chest pain:    Quality:  Sharp   Severity:  Moderate   Onset quality:  Sudden   Duration:  1 day (has also had L lateral chest wall pain intermittently at site of chest tube insertion for since CT placed 1 month ago)   Timing:  Constant   Progression:  Unchanged   Chronicity:  Recurrent Risk factors comment:  Tall, thin, hx of PTX   Past Medical History  Diagnosis Date  . Depression   . Migraine   . Croup   . Anxiety   . ADHD (attention deficit hyperactivity disorder)    History reviewed. No pertinent past surgical history. History reviewed. No pertinent family history. History  Substance Use Topics  . Smoking status: Former Games developer  . Smokeless tobacco: Not on file  . Alcohol Use: Yes     Comment: occasional    Review of Systems  Constitutional: Negative for fever, diaphoresis, activity change, appetite change and fatigue.  HENT: Negative for congestion, facial swelling, rhinorrhea, sore throat and trouble swallowing.   Eyes: Negative for photophobia and pain.  Respiratory: Positive for shortness of  breath. Negative for cough, sputum production and chest tightness.   Cardiovascular: Positive for chest pain. Negative for claudication and leg swelling.  Gastrointestinal: Negative for nausea, vomiting, abdominal pain, diarrhea and constipation.  Endocrine: Negative for polydipsia and polyuria.  Genitourinary: Negative for dysuria, urgency, decreased urine volume and difficulty urinating.  Musculoskeletal: Negative for back pain and gait problem.  Skin: Negative for color change, rash and wound.  Allergic/Immunologic: Negative for immunocompromised state.  Neurological: Negative for dizziness, facial asymmetry, speech difficulty, weakness, numbness and headaches.  Psychiatric/Behavioral: Negative for confusion, decreased concentration and agitation.      Allergies  Adderall  Home Medications   Current Outpatient Rx  Name  Route  Sig  Dispense  Refill  . albuterol (PROVENTIL HFA;VENTOLIN HFA) 108 (90 BASE) MCG/ACT inhaler   Inhalation   Inhale 2 puffs into the lungs every 6 (six) hours as needed for wheezing or shortness of breath.         . calcium carbonate (TUMS - DOSED IN MG ELEMENTAL CALCIUM) 500 MG chewable tablet   Oral   Chew 1-2 tablets by mouth 2 (two) times daily as needed for indigestion or heartburn.         . Ibuprofen (ADVIL PO)   Oral   Take 2 tablets by mouth every 8 (eight) hours as needed (pain).         . Pseudoephedrine-Ibuprofen (ADVIL COLD & SINUS LIQUI-GELS) 30-200 MG CAPS   Oral   Take 2 tablets by mouth at bedtime.          Marland Kitchen  HYDROcodone-acetaminophen (NORCO) 5-325 MG per tablet   Oral   Take 1 tablet by mouth every 6 (six) hours as needed.   10 tablet   0   . oxyCODONE (OXY IR/ROXICODONE) 5 MG immediate release tablet   Oral   Take 1 tablet (5 mg total) by mouth every 3 (three) hours as needed for moderate pain.   30 tablet   0    BP 117/93  Pulse 94  Temp(Src) 98 F (36.7 C) (Oral)  Resp 16  SpO2 98% Physical Exam   Constitutional: He is oriented to person, place, and time. He appears well-developed and well-nourished. No distress.  Anxious appearing, thin  HENT:  Head: Normocephalic and atraumatic.  Mouth/Throat: No oropharyngeal exudate.  Eyes: Pupils are equal, round, and reactive to light.  Neck: Normal range of motion. Neck supple.  Cardiovascular: Normal rate, regular rhythm and normal heart sounds.  Exam reveals no gallop and no friction rub.   No murmur heard. Pulmonary/Chest: Effort normal and breath sounds normal. No respiratory distress. He has no wheezes. He has no rales.  Abdominal: Soft. Bowel sounds are normal. He exhibits no distension and no mass. There is no tenderness. There is no rebound and no guarding.  Musculoskeletal: Normal range of motion. He exhibits no edema and no tenderness.  Neurological: He is alert and oriented to person, place, and time.  Skin: Skin is warm and dry.  Psychiatric: He has a normal mood and affect.    ED Course  Procedures (including critical care time) Labs Review Labs Reviewed  BASIC METABOLIC PANEL - Abnormal; Notable for the following:    Potassium 3.4 (*)    All other components within normal limits  CBC - Abnormal; Notable for the following:    MCHC 36.3 (*)    All other components within normal limits  PRO B NATRIURETIC PEPTIDE  I-STAT TROPOININ, ED   Imaging Review Dg Chest 2 View  08/15/2013   CLINICAL DATA:  Shortness of breath.  EXAM: CHEST  2 VIEW  COMPARISON:  Chest x-ray 07/29/2013.  FINDINGS: Lung volumes are normal. No consolidative airspace disease. No pleural effusions. No pneumothorax. No pulmonary nodule or mass noted. Pulmonary vasculature and the cardiomediastinal silhouette are within normal limits.  IMPRESSION: 1.  No radiographic evidence of acute cardiopulmonary disease.   Electronically Signed   By: Trudie Reedaniel  Entrikin M.D.   On: 08/15/2013 15:11     EKG Interpretation   Date/Time:  Sunday August 15 2013 13:08:38  EDT Ventricular Rate:  98 PR Interval:  124 QRS Duration: 88 QT Interval:  342 QTC Calculation: 436 R Axis:   80 Text Interpretation:  Normal sinus rhythm Normal ECG No significant change  was found Confirmed by DOCHERTY  MD, MEGAN ([id number]) on 08/15/2013 2:38:26 PM      MDM   Final diagnoses:  Atypical chest pain    Pt is a 28 y.o. male with Pmhx as above who presents with CP and SOB. Pt had a spontaneously PTX about 1 month ago treated w/ chest tube. He has had some intermittent L lateral chest pains at site of CT intermittently since, but began having inc SOB, and L apical pain yesterday which contined into today. On Exam, pt anxious appearing, intermittently deep breathing, but in NAD.  Lungs clear and equal BL, trachea midline, no distended neck veins.  BP 104/86.  EKG w/o ischemic changes. CXR with no signs of PTX and was otherwise nml. Pt's pain improved to 1/10 after PO  norco.  Suspect residual inflammation/irritation from recent procedure.  I feel he is safe for d/c and close outpt f/u with CTS.  Return precautions given for new or worsening symptoms including worsening pain, SOB, fever.           Shanna Cisco, MD 08/15/13 1630  Shanna Cisco, MD 08/25/13 1239

## 2013-08-15 NOTE — ED Notes (Signed)
Pt reports hx of collapsed lung one month ago, had chest tube placed. Has sob all the time since then but it has become more severe since yesterday. And having sharp left side chest and shoulder pains. ekg done at triage. Breath sounds clear bilateral.

## 2013-08-15 NOTE — Discharge Instructions (Signed)
Chest Pain (Nonspecific) °Chest pain has many causes. Your pain could be caused by something serious, such as a heart attack or a blood clot in the lungs. It could also be caused by something less serious, such as a chest bruise or a virus. Follow up with your doctor. More lab tests or other studies may be needed to find the cause of your pain. Most of the time, nonspecific chest pain will improve within 2 to 3 days of rest and mild pain medicine. °HOME CARE °· For chest bruises, you may put ice on the sore area for 15-20 minutes, 03-04 times a day. Do this only if it makes you feel better. °· Put ice in a plastic bag. °· Place a towel between the skin and the bag. °· Rest for the next 2 to 3 days. °· Go back to work if the pain improves. °· See your doctor if the pain lasts longer than 1 to 2 weeks. °· Only take medicine as told by your doctor. °· Quit smoking if you smoke. °GET HELP RIGHT AWAY IF:  °· There is more pain or pain that spreads to the arm, neck, jaw, back, or belly (abdomen). °· You have shortness of breath. °· You cough more than usual or cough up blood. °· You have very bad back or belly pain, feel sick to your stomach (nauseous), or throw up (vomit). °· You have very bad weakness. °· You pass out (faint). °· You have a fever. °Any of these problems may be serious and may be an emergency. Do not wait to see if the problems will go away. Get medical help right away. Call your local emergency services 911 in [place] Do not drive yourself to the hospital. °MAKE SURE YOU:  °· Understand these instructions. °· Will watch this condition. °· Will get help right away if you or your child is not doing well or gets worse. °Document Released: 11/13/2007 Document Revised: 08/19/2011 Document Reviewed: 11/13/2007 °ExitCare® Patient Information ©2014 [employer]. ° °

## 2013-08-18 ENCOUNTER — Encounter: Payer: Self-pay | Admitting: Cardiothoracic Surgery

## 2013-08-18 ENCOUNTER — Ambulatory Visit (INDEPENDENT_AMBULATORY_CARE_PROVIDER_SITE_OTHER): Payer: BC Managed Care – PPO | Admitting: Cardiothoracic Surgery

## 2013-08-18 ENCOUNTER — Other Ambulatory Visit: Payer: Self-pay | Admitting: *Deleted

## 2013-08-18 VITALS — BP 121/84 | HR 104 | Resp 16 | Ht 70.0 in | Wt 132.0 lb

## 2013-08-18 DIAGNOSIS — J939 Pneumothorax, unspecified: Secondary | ICD-10-CM

## 2013-08-18 DIAGNOSIS — R079 Chest pain, unspecified: Secondary | ICD-10-CM

## 2013-08-18 DIAGNOSIS — Z8709 Personal history of other diseases of the respiratory system: Secondary | ICD-10-CM

## 2013-08-18 DIAGNOSIS — R0602 Shortness of breath: Secondary | ICD-10-CM

## 2013-08-18 NOTE — Progress Notes (Signed)
PCP is Aura DialsBOUSKA,DAVID E, MD Referring Provider is Aura DialsBouska, David E, MD  Chief Complaint  Patient presents with  . Spontaneous Pneumothorax    F/U from ED visit on 08/15/13, discuss RTW issues    HPI:  Patient returns for one month followup after chest tube treatment of a left spontaneous pneumothorax-90%. He is having recurrent generalized sharp pains over his left chest associated with some anxiety and shortness of breath. He has been taking hydrocodone without much help in which also causes nausea. He presented to the ED week ago with pain and shortness of breath and was concerned about a recurrent pneumothorax. The chest x-ray was clear. Patient has not had history asthma and he is been having no wheezing. He stopped smoking last summer.  Past Medical History  Diagnosis Date  . Depression   . Migraine   . Croup   . Anxiety   . ADHD (attention deficit hyperactivity disorder)     No past surgical history on file.  No family history on file.  Social History History  Substance Use Topics  . Smoking status: Former Smoker -- 1.00 packs/day for 10 years    Types: Cigarettes    Quit date: 12/18/2012  . Smokeless tobacco: Never Used  . Alcohol Use: Yes     Comment: occasional    Current Outpatient Prescriptions  Medication Sig Dispense Refill  . albuterol (PROVENTIL HFA;VENTOLIN HFA) 108 (90 BASE) MCG/ACT inhaler Inhale 2 puffs into the lungs every 6 (six) hours as needed for wheezing or shortness of breath.      . calcium carbonate (TUMS - DOSED IN MG ELEMENTAL CALCIUM) 500 MG chewable tablet Chew 1-2 tablets by mouth 2 (two) times daily as needed for indigestion or heartburn.      Marland Kitchen. HYDROcodone-acetaminophen (NORCO) 5-325 MG per tablet Take 1 tablet by mouth every 6 (six) hours as needed.  10 tablet  0  . Ibuprofen (ADVIL PO) Take 2 tablets by mouth every 8 (eight) hours as needed (pain).      . Pseudoephedrine-Ibuprofen (ADVIL COLD & SINUS LIQUI-GELS) 30-200 MG CAPS Take 2 tablets  by mouth at bedtime.        No current facility-administered medications for this visit.    Allergies  Allergen Reactions  . Adderall [Amphetamine-Dextroamphetamine]     Causes altered mental status, rage    Review of Systems patient has not been sleeping well. His appetite has been poor. He is been having pain at work when he over extends his left arm.  BP 121/84  Pulse 104  Resp 16  Ht 5\' 10"  (1.778 m)  Wt 132 lb (59.875 kg)  BMI 18.94 kg/m2  SpO2 99% Physical Exam Anxious young patient Lungs clear Left chest tube site well-healed Heart rate 100 tachycardia sinus rhythm Chest tube site well-healed Patient was ambulated 300 feet around the office without a drop  in oxygen saturation-- stayed at 99% Diagnostic Tests: Most recent chest x-ray reviewed showing no evidence of pneumothorax no pleural effusion  Impression: Post left chest tube neuritic-type pain. Anxiety a component as well. We'll start with Neurontin 300 mg by mouth each bedtime. Patient was reassured that he does not have a pneumothorax. Plan:  Neurontin trials were neuritic-type pain  CT scan of chest to evaluate any underlying structural abnormalities of left lung-i.e. blebs  Returned office in 2 weeks

## 2013-09-01 ENCOUNTER — Ambulatory Visit
Admission: RE | Admit: 2013-09-01 | Discharge: 2013-09-01 | Disposition: A | Discharge status: Home / Self Care | Payer: BC Managed Care – PPO | Source: Ambulatory Visit | Attending: Cardiothoracic Surgery | Admitting: Cardiothoracic Surgery

## 2013-09-01 ENCOUNTER — Encounter: Payer: Self-pay | Admitting: Cardiothoracic Surgery

## 2013-09-01 ENCOUNTER — Ambulatory Visit (INDEPENDENT_AMBULATORY_CARE_PROVIDER_SITE_OTHER): Payer: BC Managed Care – PPO | Admitting: Cardiothoracic Surgery

## 2013-09-01 VITALS — BP 117/73 | HR 90 | Resp 20 | Ht 70.0 in | Wt 132.0 lb

## 2013-09-01 DIAGNOSIS — J939 Pneumothorax, unspecified: Secondary | ICD-10-CM

## 2013-09-01 DIAGNOSIS — M792 Neuralgia and neuritis, unspecified: Secondary | ICD-10-CM

## 2013-09-01 DIAGNOSIS — IMO0002 Reserved for concepts with insufficient information to code with codable children: Secondary | ICD-10-CM

## 2013-09-01 DIAGNOSIS — Z8709 Personal history of other diseases of the respiratory system: Secondary | ICD-10-CM

## 2013-09-01 MED ORDER — IOHEXOL 300 MG/ML  SOLN
75.0000 mL | Freq: Once | INTRAMUSCULAR | Status: AC | PRN
  Administered 2013-09-01: 75 mL via INTRAVENOUS

## 2013-09-01 NOTE — Progress Notes (Signed)
PCP is Aura DialsBOUSKA,DAVID E, MD Referring Provider is Aura DialsBouska, David E, MD  Chief Complaint  Patient presents with  . Spontaneous Pneumothorax    2 week f/u with Chest CT    HPI: The patient returns for routine followup after a left spontaneous pneumothorax was treated with chest tube thoracostomy February 2015. On last visit the patient complained of pain at the chest tube site and shortness of breath with exertion. Chest x-ray at that time showed no pneumothorax. The patient is returned for followup and to monitor progress. His shortness of breath is improved. A CT scan of the chest done today shows no significant bleb disease and no pneumothorax-the lungs are clear and there is no significant adenopathy.  The patient states taking Neurontin has helped the neuritic-type pain at the chest tube site.  The patient has returned to full time employment. He is nonsmoker.  Past Medical History  Diagnosis Date  . Depression   . Migraine   . Croup   . Anxiety   . ADHD (attention deficit hyperactivity disorder)     No past surgical history on file.  No family history on file.  Social History History  Substance Use Topics  . Smoking status: Former Smoker -- 1.00 packs/day for 10 years    Types: Cigarettes    Quit date: 12/18/2012  . Smokeless tobacco: Never Used  . Alcohol Use: Yes     Comment: occasional    Current Outpatient Prescriptions  Medication Sig Dispense Refill  . albuterol (PROVENTIL HFA;VENTOLIN HFA) 108 (90 BASE) MCG/ACT inhaler Inhale 2 puffs into the lungs every 6 (six) hours as needed for wheezing or shortness of breath.      . calcium carbonate (TUMS - DOSED IN MG ELEMENTAL CALCIUM) 500 MG chewable tablet Chew 1-2 tablets by mouth 2 (two) times daily as needed for indigestion or heartburn.      . gabapentin (NEURONTIN) 300 MG capsule Take 300 mg by mouth at bedtime.      Marland Kitchen. HYDROcodone-acetaminophen (NORCO) 5-325 MG per tablet Take 1 tablet by mouth every 6 (six) hours  as needed.  10 tablet  0  . Ibuprofen (ADVIL PO) Take 2 tablets by mouth every 8 (eight) hours as needed (pain).      . Pseudoephedrine-Ibuprofen (ADVIL COLD & SINUS LIQUI-GELS) 30-200 MG CAPS Take 2 tablets by mouth at bedtime.        No current facility-administered medications for this visit.    Allergies  Allergen Reactions  . Adderall [Amphetamine-Dextroamphetamine]     Causes altered mental status, rage    Review of Systems overall much improved since chest tube therapy for spontaneous left pneumothorax  BP 117/73  Pulse 90  Resp 20  Ht 5\' 10"  (1.778 m)  Wt 132 lb (59.875 kg)  BMI 18.94 kg/m2  SpO2 98% Physical Exam Alert and comfortable Lungs clear Heart rate regular  Diagnostic Tests: CT scan of chest results reviewed patient showing no evidence of bleb disease or pneumothorax, clear lungs. Patient reassured  Impression: No evidence of significant structural lung disease by CT scan of chest  Plan: Return as needed. The patient was told if symptoms of spontaneous pneumothorax recurred he should report to urgent care or emergency department for chest x-ray immediately

## 2014-12-07 IMAGING — CR DG CHEST 2V
2 series · 2 of 2 positions shown · non-contrast
Comparison: DG CHEST 2 VIEW dated 07/19/2013

CLINICAL DATA: History of pneumothorax, follow-up,

EXAM:
CHEST  2 VIEW

[w chest pa]
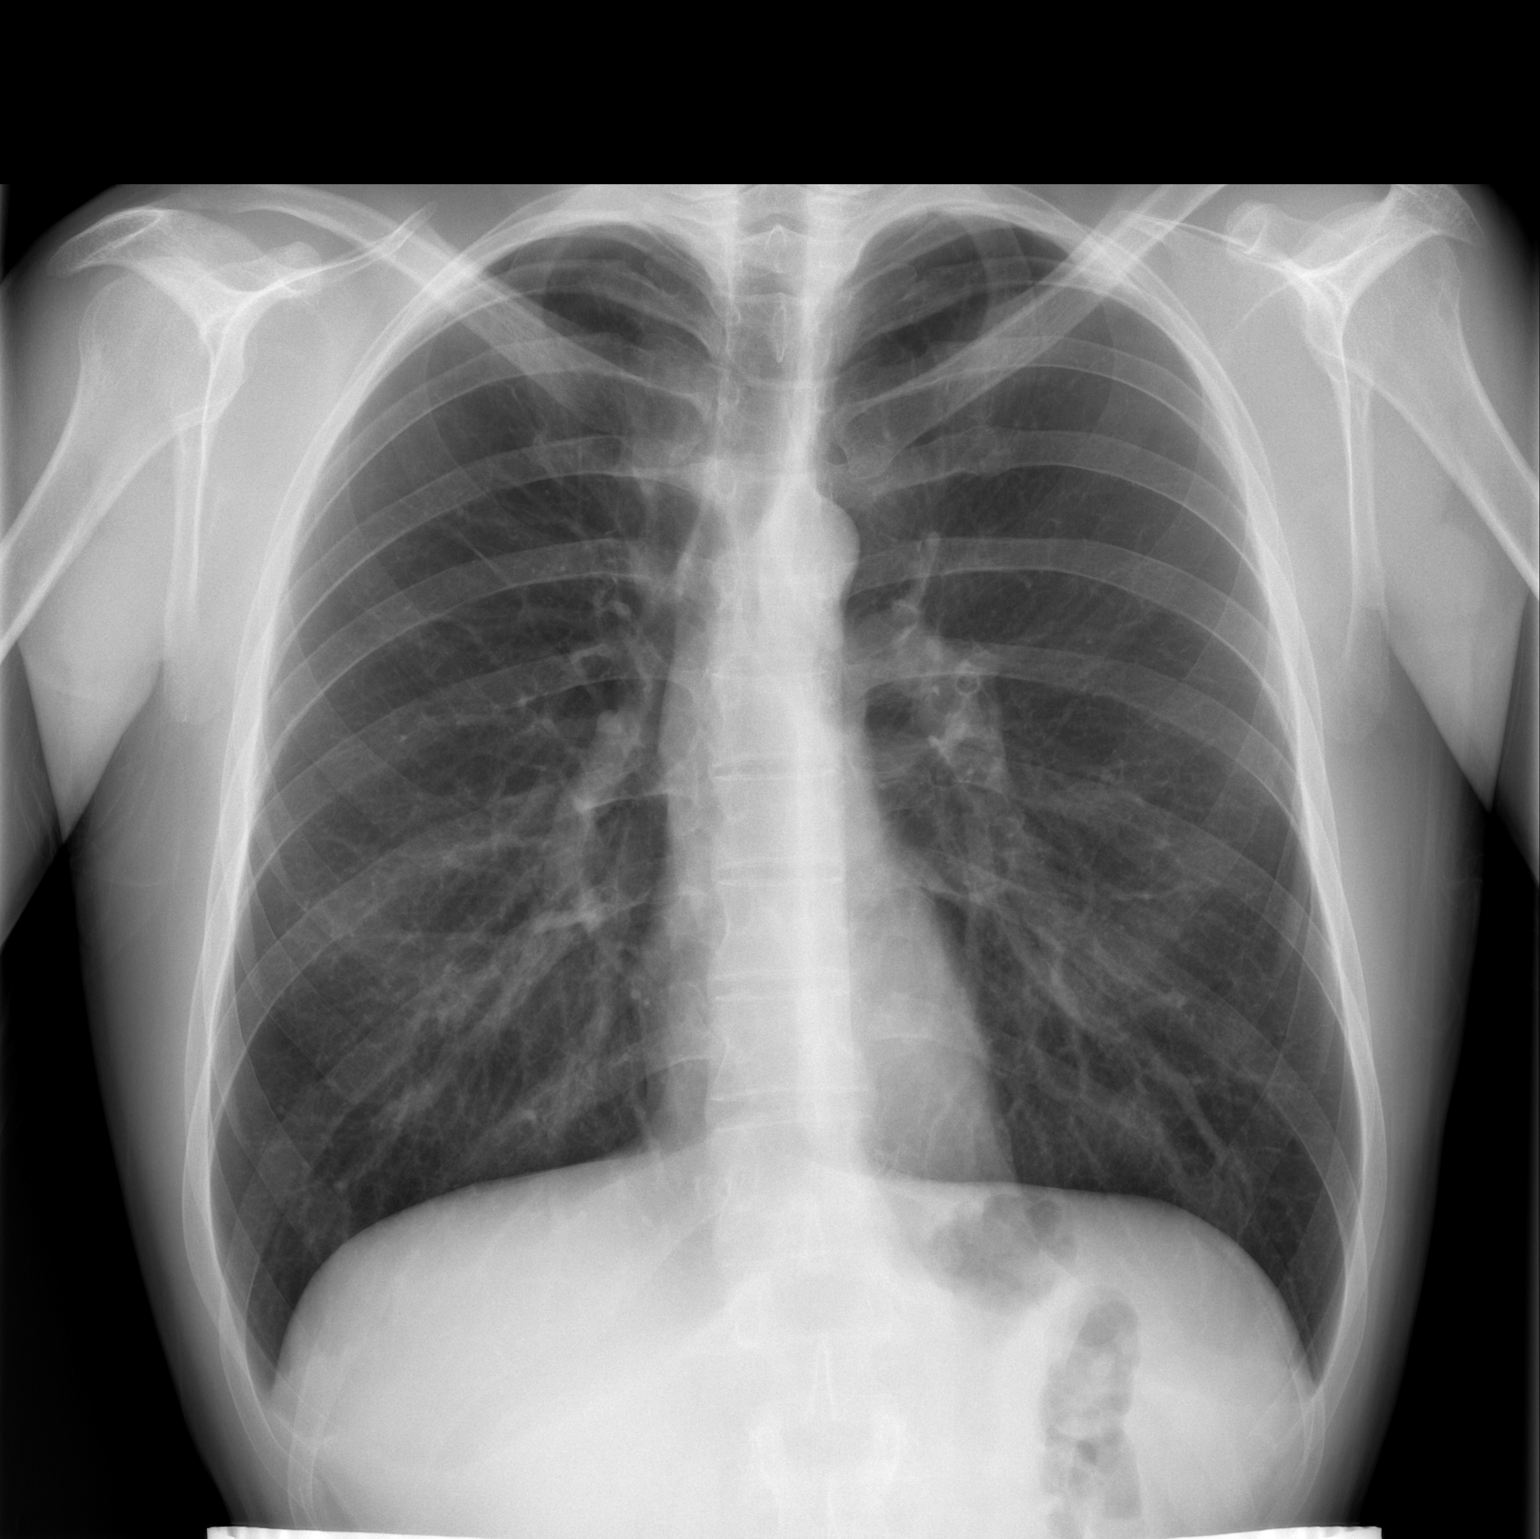

[w chest lat]
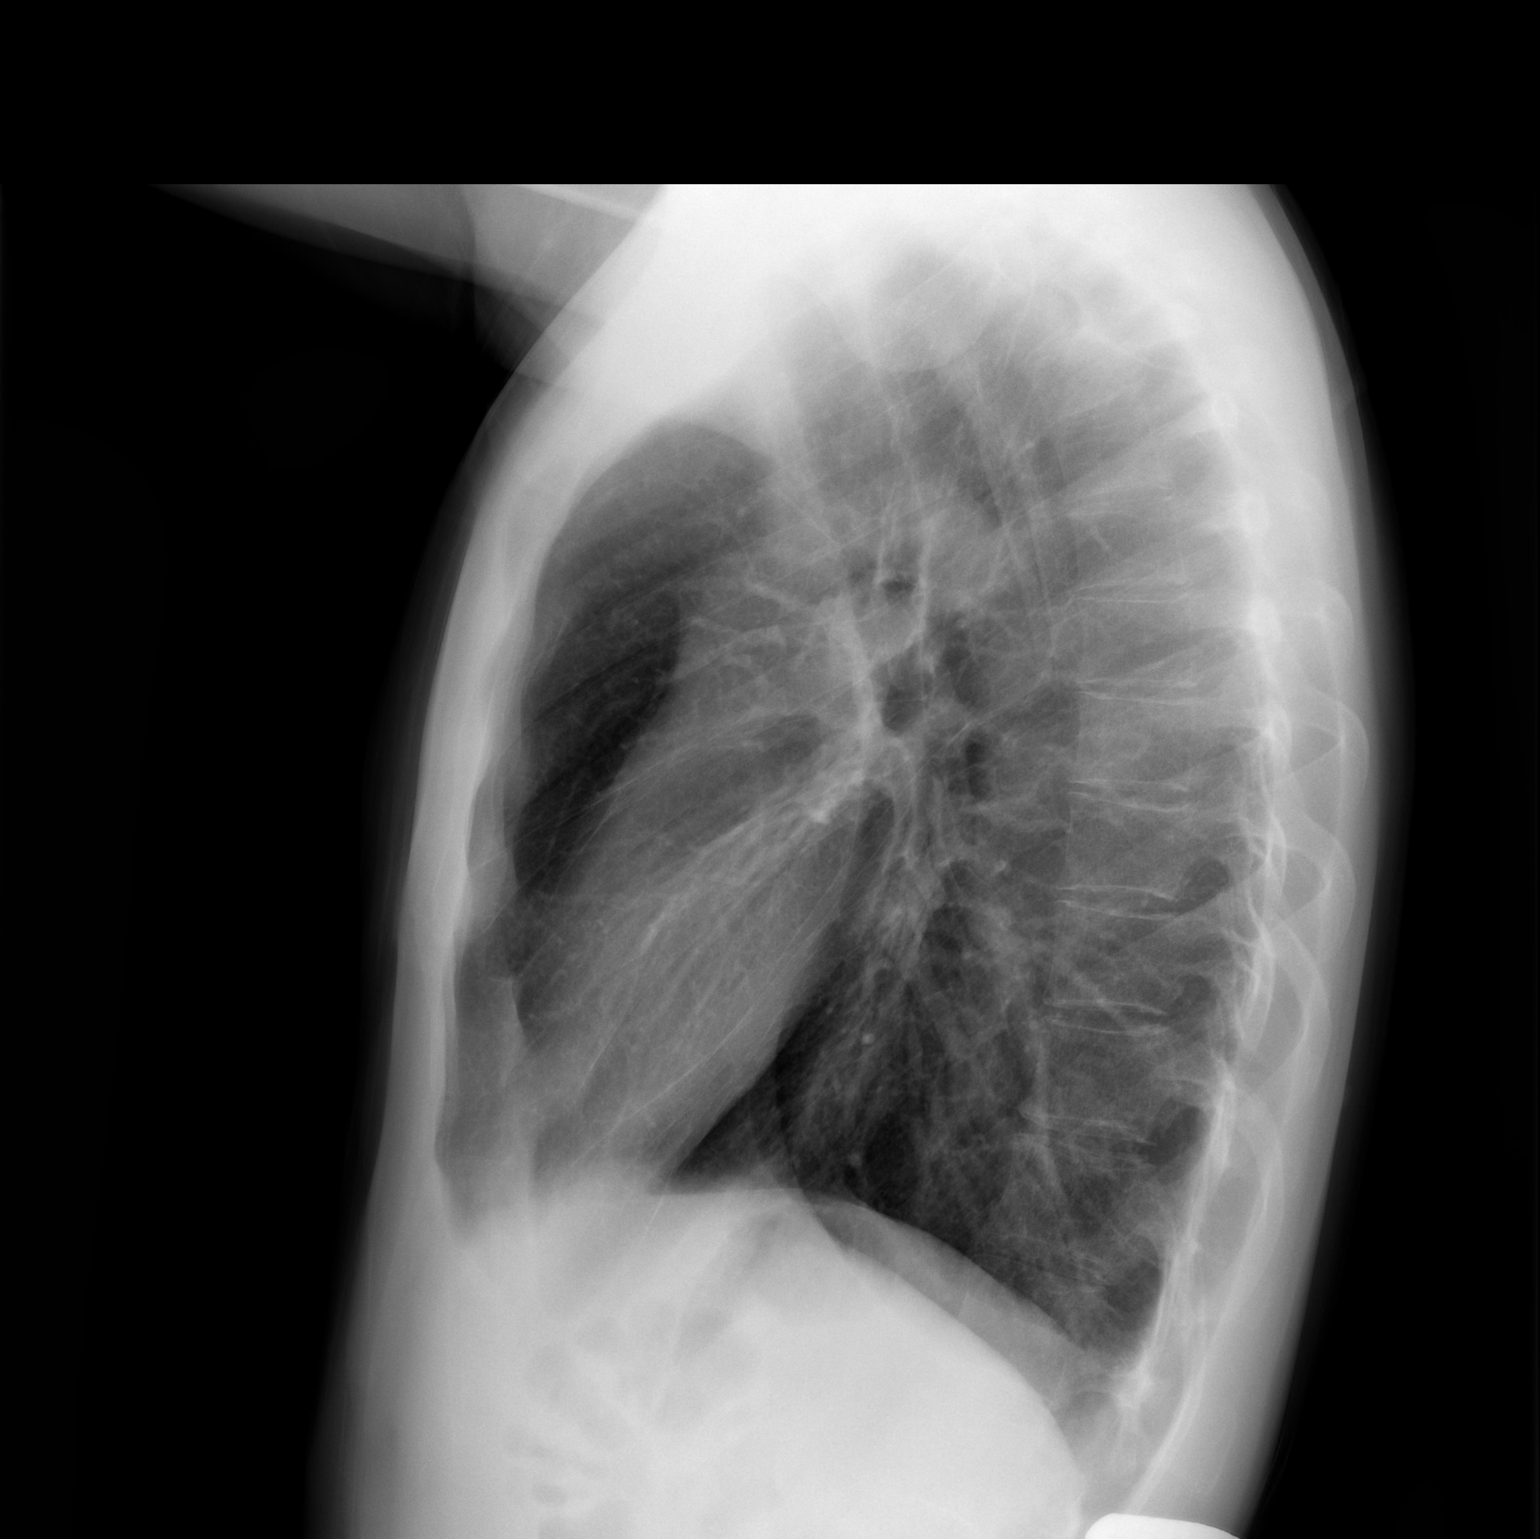

[2 of 2 positions shown; findings below may reference images not displayed]

FINDINGS: The left apical pneumothorax has resolved. There is no
pneumomediastinum. Both lungs are well-expanded. There is no pleural
effusion. The heart and mediastinal structures are within the limits
of normal. No acute bony abnormality is demonstrated.
IMPRESSION: There has been interval resolution of the left apical pneumothorax.

## 2016-01-08 DIAGNOSIS — Z0000 Encounter for general adult medical examination without abnormal findings: Secondary | ICD-10-CM | POA: Diagnosis not present

## 2016-01-08 DIAGNOSIS — Z6823 Body mass index (BMI) 23.0-23.9, adult: Secondary | ICD-10-CM | POA: Diagnosis not present

## 2016-01-08 DIAGNOSIS — R1013 Epigastric pain: Secondary | ICD-10-CM | POA: Diagnosis not present

## 2016-03-07 DIAGNOSIS — J029 Acute pharyngitis, unspecified: Secondary | ICD-10-CM | POA: Diagnosis not present

## 2016-03-07 DIAGNOSIS — Z6821 Body mass index (BMI) 21.0-21.9, adult: Secondary | ICD-10-CM | POA: Diagnosis not present

## 2017-01-13 ENCOUNTER — Encounter: Payer: Self-pay | Admitting: Family

## 2017-01-13 ENCOUNTER — Ambulatory Visit (INDEPENDENT_AMBULATORY_CARE_PROVIDER_SITE_OTHER): Payer: BLUE CROSS/BLUE SHIELD | Admitting: Family

## 2017-01-13 DIAGNOSIS — Z0000 Encounter for general adult medical examination without abnormal findings: Secondary | ICD-10-CM

## 2017-01-13 NOTE — Progress Notes (Signed)
Subjective:    Patient ID: Sean Carroll, male    DOB: October 06, 1985, 31 y.o.   MRN: [id number]  Chief Complaint  Patient presents with  . Establish Care    CPE, not fasting    HPI:  Sean Carroll is a 31 y.o. male who presents today for an annual wellness visit.   1) Health Maintenance -   Diet - Averages about 2-3 meals per day consisting of a regular diet; Caffeine intake of about 1-2 cups per day.   Exercise -  Generally active with no structured exercise   2) Preventative Exams / Immunizations:  Dental -- Due for exam  Vision -- Up to date   Health Maintenance  Topic Date Due  . HIV Screening  08/23/1998  . TETANUS/TDAP  08/23/2002  . INFLUENZA VACCINE  01/08/2017     There is no immunization history on file for this patient.   Allergies  Allergen Reactions  . Adderall [Amphetamine-Dextroamphetamine]     Causes altered mental status, rage     Outpatient Medications Prior to Visit  Medication Sig Dispense Refill  . calcium carbonate (TUMS - DOSED IN MG ELEMENTAL CALCIUM) 500 MG chewable tablet Chew 1-2 tablets by mouth 2 (two) times daily as needed for indigestion or heartburn.    . Pseudoephedrine-Ibuprofen (ADVIL COLD & SINUS LIQUI-GELS) 30-200 MG CAPS Take 2 tablets by mouth at bedtime.     Marland Kitchen. albuterol (PROVENTIL HFA;VENTOLIN HFA) 108 (90 BASE) MCG/ACT inhaler Inhale 2 puffs into the lungs every 6 (six) hours as needed for wheezing or shortness of breath.    . gabapentin (NEURONTIN) 300 MG capsule Take 300 mg by mouth at bedtime.    Marland Kitchen. HYDROcodone-acetaminophen (NORCO) 5-325 MG per tablet Take 1 tablet by mouth every 6 (six) hours as needed. 10 tablet 0  . Ibuprofen (ADVIL PO) Take 2 tablets by mouth every 8 (eight) hours as needed (pain).     No facility-administered medications prior to visit.      Past Medical History:  Diagnosis Date  . ADHD (attention deficit hyperactivity disorder)   . Anxiety   . Chicken pox   . Croup   . Depression   .  Lung collapse   . Migraine      History reviewed. No pertinent surgical history.   Family History  Problem Relation Age of Onset  . Heart disease Mother   . Seizures Mother   . Breast cancer Maternal Grandmother      Social History   Social History  . Marital status: Married    Spouse name: N/A  . Number of children: 3  . Years of education: 12   Occupational History  . Retail bankerAircraft Mechanic    Social History Main Topics  . Smoking status: Former Smoker    Packs/day: 1.00    Years: 10.00    Types: Cigarettes    Quit date: 12/18/2012  . Smokeless tobacco: Never Used  . Alcohol use Yes     Comment: occasional  . Drug use: No     Comment: former user of drugs  . Sexual activity: Not on file   Other Topics Concern  . Not on file   Social History Narrative   Fun/Hobby: [employer], being with family, and shooting      Review of Systems  Constitutional: Denies fever, chills, fatigue, or significant weight gain/loss. HENT: Head: Denies headache or neck pain Ears: Denies changes in hearing, ringing in ears, earache, drainage Nose: Denies discharge, stuffiness,  itching, nosebleed, sinus pain Throat: Denies sore throat, hoarseness, dry mouth, sores, thrush Eyes: Denies loss/changes in vision, pain, redness, blurry/double vision, flashing lights Cardiovascular: Denies chest pain/discomfort, tightness, palpitations, shortness of breath with activity, difficulty lying down, swelling, sudden awakening with shortness of breath Respiratory: Denies shortness of breath, cough, sputum production, wheezing Gastrointestinal: Denies dysphasia, heartburn, change in appetite, nausea, change in bowel habits, rectal bleeding, constipation, diarrhea, yellow skin or eyes Genitourinary: Denies frequency, urgency, burning/pain, blood in urine, incontinence, change in urinary strength. Musculoskeletal: Denies muscle/joint pain, stiffness, back pain, redness or swelling of joints, trauma Skin:  Denies rashes, lumps, itching, dryness, color changes, or hair/nail changes Neurological: Denies dizziness, fainting, seizures, weakness, numbness, tingling, tremor Psychiatric - Denies nervousness, stress, depression or memory loss Endocrine: Denies heat or cold intolerance, sweating, frequent urination, excessive thirst, changes in appetite Hematologic: Denies ease of bruising or bleeding     Objective:     BP 108/70 (BP Location: Left Arm, Patient Position: Sitting, Cuff Size: Normal)   Pulse 99   Temp 98.6 F (37 C) (Oral)   Resp 16   Ht 5\' 10"  (1.778 m)   Wt 142 lb 12.8 oz (64.8 kg)   SpO2 97%   BMI 20.49 kg/m  Nursing note and vital signs reviewed.  Physical Exam  Constitutional: He is oriented to person, place, and time. He appears well-developed and well-nourished.  HENT:  Head: Normocephalic.  Right Ear: Hearing, tympanic membrane, external ear and ear canal normal.  Left Ear: Hearing, tympanic membrane, external ear and ear canal normal.  Nose: Nose normal.  Mouth/Throat: Uvula is midline, oropharynx is clear and moist and mucous membranes are normal.  Eyes: Pupils are equal, round, and reactive to light. Conjunctivae and EOM are normal.  Neck: Neck supple. No JVD present. No tracheal deviation present. No thyromegaly present.  Cardiovascular: Normal rate, regular rhythm, normal heart sounds and intact distal pulses.   Pulmonary/Chest: Effort normal and breath sounds normal.  Abdominal: Soft. Bowel sounds are normal. He exhibits no distension and no mass. There is no tenderness. There is no rebound and no guarding.  Musculoskeletal: Normal range of motion. He exhibits no edema or tenderness.  Lymphadenopathy:    He has no cervical adenopathy.  Neurological: He is alert and oriented to person, place, and time. He has normal reflexes. No cranial nerve deficit. He exhibits normal muscle tone. Coordination normal.  Skin: Skin is warm and dry.  Psychiatric: He has a  normal mood and affect. His behavior is normal. Judgment and thought content normal.       Assessment & Plan:   Problem List Items Addressed This Visit      Other   Routine adult health maintenance    1) Anticipatory Guidance: Discussed importance of wearing a seatbelt while driving and not texting while driving; changing batteries in smoke detector at least once annually; wearing suntan lotion when outside; eating a balanced and moderate diet; getting physical activity at least 30 minutes per day.  2) Immunizations / Screenings / Labs:  Declines tetanus. All other immunizations are up to date per recommendations. Due for a dental screen encouraged to be completed independently. All other screenings are up to date per recommendations. Obtain CBC, CMET, and lipid profile.    Overall well exam with minimal risk factors and appears to be in overall good health. He is of good weight and is active. Encouraged to continue to consume a nutritional intake that is moderate, balanced and varied. Follow up  prevention exam in 1 year. Follow up office pending blood work as needed.      Relevant Orders   CBC   Comprehensive metabolic panel   Lipid panel       I have discontinued Mr. Simmon's albuterol, Ibuprofen (ADVIL PO), HYDROcodone-acetaminophen, and gabapentin. I am also having him maintain his Pseudoephedrine-Ibuprofen and calcium carbonate.   No orders of the defined types were placed in this encounter.   Follow-up: Return in about 1 year (around 01/13/2018), or if symptoms worsen or fail to improve.   Jeanine Luz, FNP

## 2017-01-13 NOTE — Assessment & Plan Note (Signed)
1) Anticipatory Guidance: Discussed importance of wearing a seatbelt while driving and not texting while driving; changing batteries in smoke detector at least once annually; wearing suntan lotion when outside; eating a balanced and moderate diet; getting physical activity at least 30 minutes per day.  2) Immunizations / Screenings / Labs:  Declines tetanus. All other immunizations are up to date per recommendations. Due for a dental screen encouraged to be completed independently. All other screenings are up to date per recommendations. Obtain CBC, CMET, and lipid profile.    Overall well exam with minimal risk factors and appears to be in overall good health. He is of good weight and is active. Encouraged to continue to consume a nutritional intake that is moderate, balanced and varied. Follow up prevention exam in 1 year. Follow up office pending blood work as needed.

## 2017-01-13 NOTE — Patient Instructions (Signed)
Thank you for choosing [facility].  SUMMARY AND INSTRUCTIONS:  Labs:  Please stop by the lab on the lower level of the building for your blood work. Your results will be released to MyChart (or called to you) after review, usually within 72 hours after test completion. If any changes need to be made, you will be notified at that same time.  1.) The lab is open from 7:30am to 5:30 pm Monday-Friday 2.) No appointment is necessary 3.) Fasting (if needed) is 6-8 hours after food and drink; black coffee and water are okay   Follow up:  If your symptoms worsen or fail to improve, please contact our office for further instruction, or in case of emergency go directly to the emergency room at the closest medical facility.     Health Maintenance, Male A healthy lifestyle and preventive care is important for your health and wellness. Ask your health care provider about what schedule of regular examinations is right for you. What should I know about weight and diet? Eat a Healthy Diet  Eat plenty of vegetables, fruits, whole grains, low-fat dairy products, and lean protein.  Do not eat a lot of foods high in solid fats, added sugars, or salt.  Maintain a Healthy Weight Regular exercise can help you achieve or maintain a healthy weight. You should:  Do at least 150 minutes of exercise each week. The exercise should increase your heart rate and make you sweat (moderate-intensity exercise).  Do strength-training exercises at least twice a week.  Watch Your Levels of Cholesterol and Blood Lipids  Have your blood tested for lipids and cholesterol every 5 years starting at 33 years of age. If you are at high risk for heart disease, you should start having your blood tested when you are 33 years old. You may need to have your cholesterol levels checked more often if: ? Your lipid or cholesterol levels are high. ? You are older than 33 years of age. ? You are at high risk for heart  disease.  What should I know about cancer screening? Many types of cancers can be detected early and may often be prevented. Lung Cancer  You should be screened every year for lung cancer if: ? You are a current smoker who has smoked for at least 30 years. ? You are a former smoker who has quit within the past 15 years.  Talk to your health care provider about your screening options, when you should start screening, and how often you should be screened.  Colorectal Cancer  Routine colorectal cancer screening usually begins at 33 years of age and should be repeated every 5-10 years until you are 33 years old. You may need to be screened more often if early forms of precancerous polyps or small growths are found. Your health care provider may recommend screening at an earlier age if you have risk factors for colon cancer.  Your health care provider may recommend using home test kits to check for hidden blood in the stool.  A small camera at the end of a tube can be used to examine your colon (sigmoidoscopy or colonoscopy). This checks for the earliest forms of colorectal cancer.  Prostate and Testicular Cancer  Depending on your age and overall health, your health care provider may do certain tests to screen for prostate and testicular cancer.  Talk to your health care provider about any symptoms or concerns you have about testicular or prostate cancer.  Skin Cancer  Check   your skin from head to toe regularly.  Tell your health care provider about any new moles or changes in moles, especially if: ? There is a change in a mole's size, shape, or color. ? You have a mole that is larger than a pencil eraser.  Always use sunscreen. Apply sunscreen liberally and repeat throughout the day.  Protect yourself by wearing long sleeves, pants, a wide-brimmed hat, and sunglasses when outside.  What should I know about heart disease, diabetes, and high blood pressure?  If you are 18-39 years  of age, have your blood pressure checked every 3-5 years. If you are 40 years of age or older, have your blood pressure checked every year. You should have your blood pressure measured twice-once when you are at a hospital or clinic, and once when you are not at a hospital or clinic. Record the average of the two measurements. To check your blood pressure when you are not at a hospital or clinic, you can use: ? An automated blood pressure machine at a pharmacy. ? A home blood pressure monitor.  Talk to your health care provider about your target blood pressure.  If you are between 45-79 years old, ask your health care provider if you should take aspirin to prevent heart disease.  Have regular diabetes screenings by checking your fasting blood sugar level. ? If you are at a normal weight and have a low risk for diabetes, have this test once every three years after the age of 45. ? If you are overweight and have a high risk for diabetes, consider being tested at a younger age or more often.  A one-time screening for abdominal aortic aneurysm (AAA) by ultrasound is recommended for men aged 65-75 years who are current or former smokers. What should I know about preventing infection? Hepatitis B If you have a higher risk for hepatitis B, you should be screened for this virus. Talk with your health care provider to find out if you are at risk for hepatitis B infection. Hepatitis C Blood testing is recommended for:  Everyone born from 1945 through 1965.  Anyone with known risk factors for hepatitis C.  Sexually Transmitted Diseases (STDs)  You should be screened each year for STDs including gonorrhea and chlamydia if: ? You are sexually active and are younger than 33 years of age. ? You are older than 33 years of age and your health care provider tells you that you are at risk for this type of infection. ? Your sexual activity has changed since you were last screened and you are at an increased  risk for chlamydia or gonorrhea. Ask your health care provider if you are at risk.  Talk with your health care provider about whether you are at high risk of being infected with HIV. Your health care provider may recommend a prescription medicine to help prevent HIV infection.  What else can I do?  Schedule regular health, dental, and eye exams.  Stay current with your vaccines (immunizations).  Do not use any tobacco products, such as cigarettes, chewing tobacco, and e-cigarettes. If you need help quitting, ask your health care provider.  Limit alcohol intake to no more than 2 drinks per day. One drink equals 12 ounces of beer, 5 ounces of wine, or 1 ounces of hard liquor.  Do not use street drugs.  Do not share needles.  Ask your health care provider for help if you need support or information about quitting drugs.  Tell your   health care provider if you often feel depressed.  Tell your health care provider if you have ever been abused or do not feel safe at home. This information is not intended to replace advice given to you by your health care provider. Make sure you discuss any questions you have with your health care provider. Document Released: 11/23/2007 Document Revised: 01/24/2016 Document Reviewed: 02/28/2015 Elsevier Interactive Patient Education  2018 [employer]  

## 2017-01-20 ENCOUNTER — Encounter: Payer: Self-pay | Admitting: Family

## 2017-01-20 ENCOUNTER — Other Ambulatory Visit (INDEPENDENT_AMBULATORY_CARE_PROVIDER_SITE_OTHER): Payer: BLUE CROSS/BLUE SHIELD

## 2017-01-20 DIAGNOSIS — Z0000 Encounter for general adult medical examination without abnormal findings: Secondary | ICD-10-CM | POA: Diagnosis not present

## 2017-01-20 LAB — COMPREHENSIVE METABOLIC PANEL
ALBUMIN: 4.6 g/dL (ref 3.5–5.2)
ALK PHOS: 36 U/L — AB (ref 39–117)
ALT: 14 U/L (ref 0–53)
AST: 13 U/L (ref 0–37)
BILIRUBIN TOTAL: 1.2 mg/dL (ref 0.2–1.2)
BUN: 13 mg/dL (ref 6–23)
CALCIUM: 8.8 mg/dL (ref 8.4–10.5)
CO2: 22 mEq/L (ref 19–32)
Chloride: 110 mEq/L (ref 96–112)
Creatinine, Ser: 0.88 mg/dL (ref 0.40–1.50)
GFR: 105.74 mL/min (ref >60.00)
Glucose, Bld: 107 mg/dL — ABNORMAL HIGH (ref 70–99)
Potassium: 4 mEq/L (ref 3.5–5.1)
Sodium: 140 mEq/L (ref 135–145)
Total Protein: 6.6 g/dL (ref 6.0–8.3)

## 2017-01-20 LAB — CBC
HCT: 43.1 % (ref 39.0–52.0)
HEMOGLOBIN: 14.5 g/dL (ref 13.0–17.0)
MCHC: 33.7 g/dL (ref 30.0–36.0)
MCV: 86.7 fl (ref 78.0–100.0)
PLATELETS: 336 10*3/uL (ref 150.0–400.0)
RBC: 4.97 Mil/uL (ref 4.22–5.81)
RDW: 13.1 % (ref 11.5–15.5)
WBC: 5.7 10*3/uL (ref 4.0–10.5)

## 2017-01-20 LAB — LIPID PANEL
CHOLESTEROL: 132 mg/dL (ref 0–200)
HDL: 36.6 mg/dL — ABNORMAL LOW (ref >39.00)
LDL Cholesterol: 86 mg/dL (ref 0–99)
NonHDL: 95.65
TRIGLYCERIDES: 50 mg/dL (ref 0.0–149.0)
Total CHOL/HDL Ratio: 4
VLDL: 10 mg/dL (ref 0.0–40.0)

## 2017-03-06 ENCOUNTER — Ambulatory Visit (INDEPENDENT_AMBULATORY_CARE_PROVIDER_SITE_OTHER): Payer: BLUE CROSS/BLUE SHIELD | Admitting: Internal Medicine

## 2017-03-06 ENCOUNTER — Encounter: Payer: Self-pay | Admitting: Internal Medicine

## 2017-03-06 VITALS — BP 114/84 | HR 92 | Temp 98.1°F | Ht 70.0 in | Wt 138.0 lb

## 2017-03-06 DIAGNOSIS — J019 Acute sinusitis, unspecified: Secondary | ICD-10-CM

## 2017-03-06 MED ORDER — LEVOFLOXACIN 500 MG PO TABS: 500.0000 mg | ORAL_TABLET | Freq: Every day | ORAL | 0 refills | Status: AC

## 2017-03-06 NOTE — Progress Notes (Signed)
   Subjective:    Patient ID: Sean Carroll, male    DOB: 02-15-1986, 31 y.o.   MRN: [id number]  HPI   Here with 2-3 days acute onset fever, facial pain, pressure, headache, general weakness and malaise, and greenish d/c, with mild ST and cough, but pt denies chest pain, wheezing, increased sob or doe, orthopnea, PND, increased LE swelling, palpitations, dizziness or syncope. Past Medical History:  Diagnosis Date  . ADHD (attention deficit hyperactivity disorder)   . Anxiety   . Chicken pox   . Croup   . Depression   . Lung collapse   . Migraine    No past surgical history on file.  reports that he quit smoking about 4 years ago. His smoking use included Cigarettes. He has a 10.00 pack-year smoking history. He has never used smokeless tobacco. He reports that he drinks alcohol. He reports that he does not use drugs. family history includes Breast cancer in his maternal grandmother; Heart disease in his mother; Seizures in his mother. Allergies  Allergen Reactions  . Adderall [Amphetamine-Dextroamphetamine]     Causes altered mental status, rage   Current Outpatient Prescriptions on File Prior to Visit  Medication Sig Dispense Refill  . calcium carbonate (TUMS - DOSED IN MG ELEMENTAL CALCIUM) 500 MG chewable tablet Chew 1-2 tablets by mouth 2 (two) times daily as needed for indigestion or heartburn.    . Pseudoephedrine-Ibuprofen (ADVIL COLD & SINUS LIQUI-GELS) 30-200 MG CAPS Take 2 tablets by mouth at bedtime.      No current facility-administered medications on file prior to visit.    Review of Systems All otherwise neg per pt    Objective:   Physical Exam BP 114/84   Pulse 92   Temp 98.1 F (36.7 C) (Oral)   Ht  (1.778 m)   Wt 138 lb (62.6 kg)   SpO2 99%   BMI 19.80 kg/m  VS noted, mild ill Constitutional: Pt appears in NAD HENT: Head: NCAT.  Right Ear: External ear normal.  Left Ear: External ear normal.  Eyes: . Pupils are equal, round, and reactive to  light. Conjunctivae and EOM are normal Nose: without d/c or deformity Bilat tm's with mild erythema.  Max sinus areas non tender.  Pharynx with mild erythema, no exudate Neck: Neck supple. Gross normal ROM Cardiovascular: Normal rate and regular rhythm.   Pulmonary/Chest: Effort normal and breath sounds without rales or wheezing.  Neurological: Pt is alert. At baseline orientation, motor grossly intact Skin: Skin is warm. No rashes, other new lesions, no LE edema Psychiatric: Pt behavior is normal without agitation  No other exam findings    Assessment & Plan:

## 2017-03-06 NOTE — Patient Instructions (Addendum)
Please take all new medication as prescribed - the antibiotic  You can also take Delsym OTC for cough, and/or Mucinex (or it's generic off brand) for congestion, and tylenol as needed for pain.  Please continue all other medications as before, and refills have been done if requested.  Please have the pharmacy call with any other refills you may need.  Please keep your appointments with your specialists as you may have planned  You are given the work note

## 2017-03-07 ENCOUNTER — Ambulatory Visit: Payer: BLUE CROSS/BLUE SHIELD | Admitting: Internal Medicine

## 2017-03-08 NOTE — Assessment & Plan Note (Signed)
Mild to mod, for antibx course,  to f/u any worsening symptoms or concerns 

## 2018-01-16 ENCOUNTER — Encounter: Payer: Self-pay | Admitting: Family Medicine

## 2018-01-16 ENCOUNTER — Ambulatory Visit (INDEPENDENT_AMBULATORY_CARE_PROVIDER_SITE_OTHER): Payer: BLUE CROSS/BLUE SHIELD | Admitting: Family Medicine

## 2018-01-16 VITALS — BP 100/64 | HR 91 | Temp 97.6°F | Ht 70.0 in | Wt 141.4 lb

## 2018-01-16 DIAGNOSIS — Z0000 Encounter for general adult medical examination without abnormal findings: Secondary | ICD-10-CM

## 2018-01-16 DIAGNOSIS — G43909 Migraine, unspecified, not intractable, without status migrainosus: Secondary | ICD-10-CM | POA: Diagnosis not present

## 2018-01-16 LAB — CBC WITH DIFFERENTIAL/PLATELET
Basophils Absolute: 0 10*3/uL (ref 0.0–0.1)
Basophils Relative: 0.8 % (ref 0.0–3.0)
Eosinophils Absolute: 0 10*3/uL (ref 0.0–0.7)
Eosinophils Relative: 0.7 % (ref 0.0–5.0)
HEMATOCRIT: 40.3 % (ref 39.0–52.0)
Hemoglobin: 14.2 g/dL (ref 13.0–17.0)
Lymphocytes Relative: 36.2 % (ref 12.0–46.0)
Lymphs Abs: 2 10*3/uL (ref 0.7–4.0)
MCHC: 35.1 g/dL (ref 30.0–36.0)
MCV: 85.8 fl (ref 78.0–100.0)
MONOS PCT: 9.3 % (ref 3.0–12.0)
Monocytes Absolute: 0.5 10*3/uL (ref 0.1–1.0)
NEUTROS ABS: 2.9 10*3/uL (ref 1.4–7.7)
NEUTROS PCT: 53 % (ref 43.0–77.0)
PLATELETS: 287 10*3/uL (ref 150.0–400.0)
RBC: 4.7 Mil/uL (ref 4.22–5.81)
RDW: 13.1 % (ref 11.5–15.5)
WBC: 5.5 10*3/uL (ref 4.0–10.5)

## 2018-01-16 LAB — LIPID PANEL
Cholesterol: 152 mg/dL (ref 0–200)
HDL: 42.8 mg/dL (ref >39.00)
LDL Cholesterol: 90 mg/dL (ref 0–99)
NONHDL: 109.6
TRIGLYCERIDES: 97 mg/dL (ref 0.0–149.0)
Total CHOL/HDL Ratio: 4
VLDL: 19.4 mg/dL (ref 0.0–40.0)

## 2018-01-16 LAB — POC URINALSYSI DIPSTICK (AUTOMATED)
BILIRUBIN UA: NEGATIVE
Blood, UA: NEGATIVE
Glucose, UA: NEGATIVE
KETONES UA: NEGATIVE
LEUKOCYTES UA: NEGATIVE
Nitrite, UA: NEGATIVE
Protein, UA: NEGATIVE
Spec Grav, UA: 1.02 (ref 1.010–1.025)
Urobilinogen, UA: 0.2 E.U./dL
pH, UA: 6 (ref 5.0–8.0)

## 2018-01-16 LAB — BASIC METABOLIC PANEL
BUN: 14 mg/dL (ref 6–23)
CO2: 26 mEq/L (ref 19–32)
Calcium: 9.3 mg/dL (ref 8.4–10.5)
Chloride: 106 mEq/L (ref 96–112)
Creatinine, Ser: 0.99 mg/dL (ref 0.40–1.50)
GFR: 91.75 mL/min (ref >60.00)
Glucose, Bld: 91 mg/dL (ref 70–99)
POTASSIUM: 3.7 meq/L (ref 3.5–5.1)
SODIUM: 139 meq/L (ref 135–145)

## 2018-01-16 LAB — HEPATIC FUNCTION PANEL
ALK PHOS: 36 U/L — AB (ref 39–117)
ALT: 24 U/L (ref 0–53)
AST: 21 U/L (ref 0–37)
Albumin: 4.6 g/dL (ref 3.5–5.2)
BILIRUBIN DIRECT: 0.2 mg/dL (ref 0.0–0.3)
BILIRUBIN TOTAL: 1.3 mg/dL — AB (ref 0.2–1.2)
Total Protein: 7.3 g/dL (ref 6.0–8.3)

## 2018-01-16 LAB — TSH: TSH: 1.19 u[IU]/mL (ref 0.35–4.50)

## 2018-01-16 NOTE — Progress Notes (Signed)
   Subjective:    Patient ID: Sean Carroll, male    DOB: 1986-06-09, 32 y.o.   MRN: [id number]  HPI Here to establish care with us and for a well exam. He feels fine and has no concerns. He has occasional migraines but he is able to work through them with no medications. He works as an Barrister's clerkairplane mechanic. He is married with 3 children.    Review of Systems  Constitutional: Negative.   HENT: Negative.   Eyes: Negative.   Respiratory: Negative.   Cardiovascular: Negative.   Gastrointestinal: Negative.   Genitourinary: Negative.   Musculoskeletal: Negative.   Skin: Negative.   Neurological: Negative.   Psychiatric/Behavioral: Negative.        Objective:   Physical Exam  Constitutional: He is oriented to person, place, and time. He appears well-developed and well-nourished. No distress.  HENT:  Head: Normocephalic and atraumatic.  Right Ear: External ear normal.  Left Ear: External ear normal.  Nose: Nose normal.  Mouth/Throat: Oropharynx is clear and moist. No oropharyngeal exudate.  Eyes: Pupils are equal, round, and reactive to light. Conjunctivae and EOM are normal. Right eye exhibits no discharge. Left eye exhibits no discharge. No scleral icterus.  Neck: Neck supple. No JVD present. No tracheal deviation present. No thyromegaly present.  Cardiovascular: Normal rate, regular rhythm, normal heart sounds and intact distal pulses. Exam reveals no gallop and no friction rub.  No murmur heard. Pulmonary/Chest: Effort normal and breath sounds normal. No respiratory distress. He has no wheezes. He has no rales. He exhibits no tenderness.  Abdominal: Soft. Bowel sounds are normal. He exhibits no distension and no mass. There is no tenderness. There is no rebound and no guarding.  Genitourinary: Penis normal. No penile tenderness.  Musculoskeletal: Normal range of motion. He exhibits no edema or tenderness.  Lymphadenopathy:    He has no cervical adenopathy.  Neurological: He is  alert and oriented to person, place, and time. He has normal reflexes. He displays normal reflexes. No cranial nerve deficit. He exhibits normal muscle tone. Coordination normal.  Skin: Skin is warm and dry. No rash noted. He is not diaphoretic. No erythema. No pallor.  Psychiatric: He has a normal mood and affect. His behavior is normal. Judgment and thought content normal.          Assessment & Plan:  Well exam. We discussed diet and exercise. Get fasting labs.  Gershon CraneStephen Fry, MD

## 2018-06-12 ENCOUNTER — Telehealth: Payer: Self-pay | Admitting: *Deleted

## 2018-06-12 NOTE — Telephone Encounter (Signed)
That’s fine with me

## 2018-06-12 NOTE — Telephone Encounter (Signed)
I am ok with him switching to me but would not want him switched to me in epic until he actually comes in. Offer appointment to get established if wants to see me. 

## 2018-06-12 NOTE — Telephone Encounter (Signed)
I am ok with him switching to me but would not want him switched to me in epic until he actually comes in. Offer appointment to get established if wants to see me.

## 2018-06-12 NOTE — Telephone Encounter (Signed)
Copied from CRM [id number]. Topic: Appointment Scheduling - Scheduling Inquiry for Clinic >> Jun 12, 2018  8:07 AM Windy Kalata, NT wrote: Reason for CRM: patient wife is calling due to patient wanting to transfer from Dr. Clent Ridges to Dr. Carmelia Roller or Esperanza Richters at [facility] Please contact once approved.

## 2018-06-13 NOTE — Telephone Encounter (Signed)
I am fine either way also. TY.

## 2018-06-15 NOTE — Telephone Encounter (Signed)
Could you please schedule a NP appt. With dr. Carmelia Roller

## 2018-06-16 NOTE — Telephone Encounter (Signed)
Spoke with pt and informed the below, pt is schedule as Transfer pt on August 2020 with Dr Carmelia Roller. Done.

## 2018-10-29 ENCOUNTER — Emergency Department (HOSPITAL_BASED_OUTPATIENT_CLINIC_OR_DEPARTMENT_OTHER)
Admission: EM | Admit: 2018-10-29 | Discharge: 2018-10-29 | Disposition: A | Discharge status: Home / Self Care | Payer: BLUE CROSS/BLUE SHIELD | Attending: Emergency Medicine | Admitting: Emergency Medicine

## 2018-10-29 ENCOUNTER — Telehealth: Payer: Self-pay | Admitting: *Deleted

## 2018-10-29 ENCOUNTER — Ambulatory Visit: Payer: Self-pay | Admitting: *Deleted

## 2018-10-29 ENCOUNTER — Other Ambulatory Visit: Payer: Self-pay

## 2018-10-29 ENCOUNTER — Encounter (HOSPITAL_BASED_OUTPATIENT_CLINIC_OR_DEPARTMENT_OTHER): Payer: Self-pay | Admitting: Emergency Medicine

## 2018-10-29 ENCOUNTER — Emergency Department (HOSPITAL_BASED_OUTPATIENT_CLINIC_OR_DEPARTMENT_OTHER): Payer: BLUE CROSS/BLUE SHIELD

## 2018-10-29 DIAGNOSIS — Z87891 Personal history of nicotine dependence: Secondary | ICD-10-CM | POA: Insufficient documentation

## 2018-10-29 DIAGNOSIS — R0600 Dyspnea, unspecified: Secondary | ICD-10-CM | POA: Diagnosis not present

## 2018-10-29 DIAGNOSIS — R0602 Shortness of breath: Secondary | ICD-10-CM | POA: Diagnosis not present

## 2018-10-29 DIAGNOSIS — Z1159 Encounter for screening for other viral diseases: Secondary | ICD-10-CM | POA: Diagnosis not present

## 2018-10-29 DIAGNOSIS — R000 Tachycardia, unspecified: Secondary | ICD-10-CM | POA: Diagnosis not present

## 2018-10-29 DIAGNOSIS — Z20828 Contact with and (suspected) exposure to other viral communicable diseases: Secondary | ICD-10-CM | POA: Diagnosis not present

## 2018-10-29 LAB — BASIC METABOLIC PANEL
Anion gap: 11 (ref 5–15)
BUN: 13 mg/dL (ref 6–20)
CO2: 20 mmol/L — ABNORMAL LOW (ref 22–32)
Calcium: 9.3 mg/dL (ref 8.9–10.3)
Chloride: 110 mmol/L (ref 98–111)
Creatinine, Ser: 0.89 mg/dL (ref 0.61–1.24)
GFR calc Af Amer: >60 mL/min (ref >60)
GFR calc non Af Amer: >60 mL/min (ref >60)
Glucose, Bld: 114 mg/dL — ABNORMAL HIGH (ref 70–99)
Potassium: 3.4 mmol/L — ABNORMAL LOW (ref 3.5–5.1)
Sodium: 141 mmol/L (ref 135–145)

## 2018-10-29 LAB — CBC
HCT: 46.4 % (ref 39.0–52.0)
Hemoglobin: 15.5 g/dL (ref 13.0–17.0)
MCH: 28.9 pg (ref 26.0–34.0)
MCHC: 33.4 g/dL (ref 30.0–36.0)
MCV: 86.6 fL (ref 80.0–100.0)
Platelets: 362 10*3/uL (ref 150–400)
RBC: 5.36 MIL/uL (ref 4.22–5.81)
RDW: 12 % (ref 11.5–15.5)
WBC: 6.2 10*3/uL (ref 4.0–10.5)
nRBC: 0 % (ref 0.0–0.2)

## 2018-10-29 LAB — D-DIMER, QUANTITATIVE (NOT AT ARMC): D-Dimer, Quant: <0.27 ug/mL-FEU (ref 0.00–0.50)

## 2018-10-29 MED ORDER — ALBUTEROL SULFATE HFA 108 (90 BASE) MCG/ACT IN AERS
INHALATION_SPRAY | RESPIRATORY_TRACT | Status: AC
  Administered 2018-10-29: 2 via RESPIRATORY_TRACT
  Filled 2018-10-29: qty 6.7

## 2018-10-29 MED ORDER — ALBUTEROL SULFATE HFA 108 (90 BASE) MCG/ACT IN AERS
2.0000 | INHALATION_SPRAY | RESPIRATORY_TRACT | Status: DC | PRN
  Administered 2018-10-29: 11:00:00 2 via RESPIRATORY_TRACT

## 2018-10-29 MED ORDER — SODIUM CHLORIDE 0.9 % IV SOLN: 1000.0000 mL | INTRAVENOUS | Status: DC

## 2018-10-29 MED ORDER — SODIUM CHLORIDE 0.9 % IV BOLUS (SEPSIS)
1000.0000 mL | Freq: Once | INTRAVENOUS | Status: AC
  Administered 2018-10-29: 1000 mL via INTRAVENOUS

## 2018-10-29 NOTE — ED Triage Notes (Addendum)
Pt reports SOB x 2 days. Denies any COVID contacts. Denies cough. SOB is mainly with exertion. Endorses chest tightness. History of spontaneous pneumothorax.

## 2018-10-29 NOTE — ED Provider Notes (Signed)
[facility] EMERGENCY DEPARTMENT Provider Note   CSN: [id number] Arrival date & time: 10/29/18  4158    History   Chief Complaint Chief Complaint  Patient presents with  . Shortness of Breath    HPI Judah Ellett is a 33 y.o. male.     HPI Patient presents to the emergency room for evaluation of shortness of breath.  Patient states he has noted the symptoms for the last few days.  Patient states he has noticed this primarily when he is active and not as much at rest but it has been persistent over the last few days.  He has had a slight cough occasionally but nothing significant in his mind.  He denies any trouble with fevers or chills.  He denies with the symptoms.  He denies any fevers or chills.  Patient does have history of a spontaneous pneumothorax that required a chest tube back in 2015.  Patient also has anxiety.  He does not smoke and does not take any medications other than vitamins.  He called the doctor today and was instructed to come to the ED for evaluation. Past Medical History:  Diagnosis Date  . ADHD (attention deficit hyperactivity disorder)   . Anxiety   . Chicken pox   . Croup   . Depression   . Lung collapse 2015   left spontaneous pneumothorax   . Migraine     Patient Active Problem List   Diagnosis Date Noted  . Migraines 01/16/2018    Past Surgical History:  Procedure Laterality Date  . CHEST TUBE INSERTION Left 2015   for spontaneous pneumothorax         Home Medications    Prior to Admission medications   Medication Sig Start Date End Date Taking? Authorizing Provider  calcium carbonate (TUMS - DOSED IN MG ELEMENTAL CALCIUM) 500 MG chewable tablet Chew 1-2 tablets by mouth 2 (two) times daily as needed for indigestion or heartburn.    [provider]    Family History Family History  Problem Relation Age of Onset  . Heart disease Mother   . Seizures Mother   . Heart disease Father   . Breast cancer  Maternal Grandmother     Social History Social History   Tobacco Use  . Smoking status: Former Smoker    Packs/day: 1.00    Years: 10.00    Pack years: 10.00    Types: Cigarettes    Last attempt to quit: 12/18/2012    Years since quitting: 5.8  . Smokeless tobacco: Never Used  Substance Use Topics  . Alcohol use: Yes    Comment: occasional  . Drug use: No    Comment: former user of drugs     Allergies   Adderall [amphetamine-dextroamphetamine]   Review of Systems Review of Systems  All other systems reviewed and are negative.    Physical Exam Updated Vital Signs BP 116/89   Pulse (!) 110   Temp 98.2 F (36.8 C) (Oral)   Resp (!) 22   Ht 1.702 m (5\' 7" )   Wt 68 kg   SpO2 100%   BMI 23.49 kg/m   Physical Exam Vitals signs and nursing note reviewed.  Constitutional:      General: He is not in acute distress.    Appearance: He is well-developed.  HENT:     Head: Normocephalic and atraumatic.     Right Ear: External ear normal.     Left Ear: External ear normal.  Eyes:     General: No scleral icterus.       Right eye: No discharge.        Left eye: No discharge.     Conjunctiva/sclera: Conjunctivae normal.  Neck:     Musculoskeletal: Neck supple.     Trachea: No tracheal deviation.  Cardiovascular:     Rate and Rhythm: Regular rhythm. Tachycardia present.  Pulmonary:     Effort: Pulmonary effort is normal. No tachypnea, accessory muscle usage or respiratory distress.     Breath sounds: Normal breath sounds. No stridor. No decreased breath sounds, wheezing, rhonchi or rales.  Abdominal:     General: Bowel sounds are normal. There is no distension.     Palpations: Abdomen is soft.     Tenderness: There is no abdominal tenderness. There is no guarding or rebound.  Musculoskeletal:        General: No tenderness.     Right lower leg: No edema.     Left lower leg: No edema.  Skin:    General: Skin is warm and dry.     Findings: No rash.   Neurological:     Mental Status: He is alert.     Cranial Nerves: No cranial nerve deficit (no facial droop, extraocular movements intact, no slurred speech).     Sensory: No sensory deficit.     Motor: No abnormal muscle tone or seizure activity.     Coordination: Coordination normal.      ED Treatments / Results  Labs (all labs ordered are listed, but only abnormal results are displayed) Labs Reviewed  BASIC METABOLIC PANEL - Abnormal; Notable for the following components:      Result Value   Potassium 3.4 (*)    CO2 20 (*)    Glucose, Bld 114 (*)    All other components within normal limits  NOVEL CORONAVIRUS, NAA (HOSPITAL ORDER, SEND-OUT TO REF LAB)  CBC  D-DIMER, QUANTITATIVE (NOT AT [facility])    EKG EKG Interpretation  Date/Time:  Thursday Oct 29 2018 09:35:19 EDT Ventricular Rate:  139 PR Interval:    QRS Duration: 77 QT Interval:  302 QTC Calculation: 460 R Axis:   76 Text Interpretation:  Sinus tachycardia ST depr, consider ischemia, anterolateral lds Baseline wander in lead(s) V2 No old tracing to compare Confirmed by Linwood DibblesKnapp, Lola Czerwonka [phone]) on 10/29/2018 9:38:22 AM Also confirmed by Linwood DibblesKnapp, Jylian Pappalardo [phone]([id number]), editor Barbette Hairassel, Kerry [phone])  on 10/29/2018 9:40:23 AM   Radiology Dg Chest 2 View  Result Date: 10/29/2018 CLINICAL DATA:  Shortness of breath for 2 days EXAM: CHEST - 2 VIEW COMPARISON:  09/01/2013 FINDINGS: Cardiac shadow is within normal limits. The lungs are hyperinflated consistent with a vigorous inspiratory effort. No focal infiltrate or sizable effusion is seen. No bony abnormality is noted. IMPRESSION: No active cardiopulmonary disease. Electronically Signed   By: Alcide CleverMark  Lukens M.D.   On: 10/29/2018 10:05    Procedures Procedures (including critical care time)  Medications Ordered in ED Medications  sodium chloride 0.9 % bolus 1,000 mL (0 mLs Intravenous Stopped 10/29/18 1041)    Followed by  0.9 %  sodium chloride infusion (0 mLs Intravenous Hold 10/29/18  1004)  albuterol (VENTOLIN HFA) 108 (90 Base) MCG/ACT inhaler 2 puff (2 puffs Inhalation Given 10/29/18 1045)     Initial Impression / Assessment and Plan / ED Course  I have reviewed the triage vital signs and the nursing notes.  Pertinent labs & imaging results that were available during my care  of the patient were reviewed by me and considered in my medical decision making (see chart for details).  Clinical Course as of Oct 29 1046  Thu Oct 29, 2018  1610 Sinus tachycardia noted on the monitor at the bedside, heart rate ranging from low 100s to 120s   [JK]    Clinical Course User Index [JK] Linwood Dibbles, MD     Patient presented to the emergency room for evaluation of shortness of breath.  Patient was noted to be tachycardic initially but his heart rate decreased into the 90s when calm and talking.  Patient's ED work-up is reassuring.  No evidence of pneumonia.  D-dimer is negative and argues against pulmonary embolism.  No evidence of anemia or electrolyte imbalance causing his dyspnea.  Overall I suspect this may be related to anxiety but there could be a bronchospasm component.  He is not wheezing in the ED but he has been taking some antihistamines and wondered if asthma could be an issue.  I will have him try taking an inhaler at home and have him continue antihistamines.  Overall, low suspicion for covid but considering the pandemic will test for COVID 19  Ghali Morissette was evaluated in Emergency Department on 10/29/2018 for the symptoms described in the history of present illness. He was evaluated in the context of the global COVID-19 pandemic, which necessitated consideration that the patient might be at risk for infection with the SARS-CoV-2 virus that causes COVID-19. Institutional protocols and algorithms that pertain to the evaluation of patients at risk for COVID-19 are in a state of rapid change based on information released by regulatory bodies including the CDC and  federal and state organizations. These policies and algorithms were followed during the patient's care in the ED.   Final Clinical Impressions(s) / ED Diagnoses   Final diagnoses:  Dyspnea, unspecified type    ED Discharge Orders    None       Linwood Dibbles, MD 10/29/18 1048

## 2018-10-29 NOTE — ED Notes (Signed)
Pt on monitor 

## 2018-10-29 NOTE — Telephone Encounter (Signed)
Called and the patient has been seen and currently at the ED

## 2018-10-29 NOTE — Discharge Instructions (Signed)
Continue to take antihistamines daily, use the inhaler as needed, follow-up with your doctor in a week or so to be rechecked, return as needed for worsening symptoms

## 2018-10-29 NOTE — ED Notes (Signed)
Patient transported to X-ray 

## 2018-10-29 NOTE — ED Notes (Signed)
ED Provider at bedside. 

## 2018-10-29 NOTE — Telephone Encounter (Signed)
Pt has NP appt w/ Dr. Carmelia Roller 01/18/2019.

## 2018-10-29 NOTE — Telephone Encounter (Signed)
Pt reports SOB x 3 days; with minimal exertion and "Various positions." Can not lie flat at HS. Mild intermittent chest tightness. Pt states "This feels the same as when I had A spontaneous pneumothorax in 2015."  Pt directed to ED.Care advise given per protocol. States will follow disposition, wife will drive.   Reason for Disposition . [1] MODERATE difficulty breathing (e.g., speaks in phrases, SOB even at rest, pulse 100-120) AND [2] NEW-onset or WORSE than normal    H/O pneumothorax 2015 "Feels like that."  Answer Assessment - Initial Assessment Questions 1. RESPIRATORY STATUS: "Describe your breathing?" (e.g., wheezing, shortness of breath, unable to speak, severe coughing)      With exertion only or with different positions 2. ONSET: "When did this breathing problem begin?"     3 days ago 3. PATTERN "Does the difficult breathing come and go, or has it been constant since it started?"      Positional and with exertion 4. SEVERITY: "How bad is your breathing?" (e.g., mild, moderate, severe)    - MILD: No SOB at rest, mild SOB with walking, speaks normally in sentences, can lay down, no retractions, pulse < 100.    - MODERATE: SOB at rest, SOB with minimal exertion and prefers to sit, cannot lie down flat, speaks in phrases, mild retractions, audible wheezing, pulse 100-120.    - SEVERE: Very SOB at rest, speaks in single words, struggling to breathe, sitting hunched forward, retractions, pulse > 120      Moderate, can not lie flat at night 5. RECURRENT SYMPTOM: "Have you had difficulty breathing before?" If so, ask: "When was the last time?" and "What happened that time?"      Yes, spontaneous pneumothorax 2015 6. CARDIAC HISTORY: "Do you have any history of heart disease?" (e.g., heart attack, angina, bypass surgery, angioplasty)      no 7. LUNG HISTORY: "Do you have any history of lung disease?"  (e.g., pulmonary embolus, asthma, emphysema)     Yes, pneumothorax 8. CAUSE: "What do you  think is causing the breathing problem?"      "Feels like when I had a pneumothorax" 9. OTHER SYMPTOMS: "Do you have any other symptoms? (e.g., dizziness, runny nose, cough, chest pain, fever)     Occasional chest tightness 11. TRAVEL: "Have you traveled out of the country in the last month?" (e.g., travel history, exposures)       no  Protocols used: BREATHING DIFFICULTY-A-AH

## 2018-10-29 NOTE — ED Notes (Signed)
Pt appears anxious and states he feels very anxious.

## 2018-10-29 NOTE — Telephone Encounter (Signed)
Copied from CRM #[id number]. Topic: Appointment Scheduling - Prior Auth Required for Appointment >> Oct 29, 2018  7:29 AM [name], Sean Carroll: No appointment has been scheduled. Patient is requesting same day appointment. Per scheduling protocol, this appointment requires a prior authorization prior to scheduling.  Route to department's PEC pool. Patient wife called and said that she would like an appt for her husband due to a bad cough/wheezing. Please call wife to schedule appt. 

## 2018-10-29 NOTE — Telephone Encounter (Signed)
Copied from CRM [id number]. Topic: Appointment Scheduling - Prior Auth Required for Appointment >> Oct 29, 2018  7:29 AM Sean Carroll, Rosey Bath D wrote: No appointment has been scheduled. Patient is requesting same day appointment. Per scheduling protocol, this appointment requires a prior authorization prior to scheduling.  Route to department's PEC pool. Patient wife called and said that she would like an appt for her husband due to a bad cough/wheezing. Please call wife to schedule appt.

## 2018-10-29 NOTE — Telephone Encounter (Signed)
Copied from CRM #[id number]. Topic: Appointment Scheduling - Prior Auth Required for Appointment >> Oct 29, 2018  7:29 AM [name], Teresa D wrote: No appointment has been scheduled. Patient is requesting same day appointment. Per scheduling protocol, this appointment requires a prior authorization prior to scheduling.  Route to department's PEC pool. Patient wife called and said that she would like an appt for her husband due to a bad cough/wheezing. Please call wife to schedule appt. 

## 2018-10-30 LAB — NOVEL CORONAVIRUS, NAA (HOSP ORDER, SEND-OUT TO REF LAB; TAT 18-24 HRS): SARS-CoV-2, NAA: NOT DETECTED

## 2018-10-30 NOTE — Telephone Encounter (Signed)
Patient went to ED on 10/29/2018

## 2018-11-03 ENCOUNTER — Other Ambulatory Visit: Payer: Self-pay | Admitting: Family Medicine

## 2018-11-04 ENCOUNTER — Ambulatory Visit (INDEPENDENT_AMBULATORY_CARE_PROVIDER_SITE_OTHER): Payer: BLUE CROSS/BLUE SHIELD | Admitting: Family Medicine

## 2018-11-04 ENCOUNTER — Encounter: Payer: Self-pay | Admitting: Family Medicine

## 2018-11-04 ENCOUNTER — Other Ambulatory Visit: Payer: Self-pay

## 2018-11-04 DIAGNOSIS — R0602 Shortness of breath: Secondary | ICD-10-CM | POA: Diagnosis not present

## 2018-11-04 MED ORDER — FLUTICASONE PROPIONATE HFA 110 MCG/ACT IN AERO: 1.0000 | INHALATION_SPRAY | Freq: Two times a day (BID) | RESPIRATORY_TRACT | 2 refills | Status: DC

## 2018-11-04 NOTE — Progress Notes (Signed)
Subjective:    Patient ID: Sean Carroll, male    DOB: [birth year], 33 y.o.   MRN: [id number]  HPI Virtual Visit via Video Note  I connected with the patient on 11/04/18 at  9:00 AM EDT by a video enabled telemedicine application and verified that I am speaking with the correct person using two identifiers.  Location patient: home Location provider:work or home office Persons participating in the virtual visit: patient, provider  I discussed the limitations of evaluation and management by telemedicine and the availability of in person appointments. The patient expressed understanding and agreed to proceed.   HPI: Here to follow up an ER visit on 10-29-18 for SOB. About 4 days prior to that he developed some mild SOB and an occasional dry cough. No chest pain or wheezing. No fever. At the ER his exam was normal except for rapid pulse. An EKG showed only sinus tachycardia. Labs were normal, including a d dimer. His CXR was clear. He was told he either had some bronchospasm or anxiety or both. He was given a Ventolin inhaler and he has used this every 4-5 hours. This does help a lot but it wears off after 2-3 hours.    ROS: See pertinent positives and negatives per HPI.  Past Medical History:  Diagnosis Date  . ADHD (attention deficit hyperactivity disorder)   . Anxiety   . Chicken pox   . Croup   . Depression   . Lung collapse 2015   left spontaneous pneumothorax   . Migraine     Past Surgical History:  Procedure Laterality Date  . CHEST TUBE INSERTION Left 2015   for spontaneous pneumothorax     Family History  Problem Relation Age of Onset  . Heart disease Mother   . Seizures Mother   . Heart disease Father   . Breast cancer Maternal Grandmother      Current Outpatient Medications:  .  albuterol (VENTOLIN HFA) 108 (90 Base) MCG/ACT inhaler, Inhale 1-2 puffs into the lungs every 6 (six) hours as needed for shortness of breath., Disp: 1 Inhaler, Rfl: 0 .   calcium carbonate (TUMS - DOSED IN MG ELEMENTAL CALCIUM) 500 MG chewable tablet, Chew 1-2 tablets by mouth 2 (two) times daily as needed for indigestion or heartburn., Disp: , Rfl:  .  fluticasone (FLOVENT HFA) 110 MCG/ACT inhaler, Inhale 1 puff into the lungs 2 (two) times daily., Disp: 1 Inhaler, Rfl: 2  EXAM:  VITALS per patient if applicable:  GENERAL: alert, oriented, appears well and in no acute distress  HEENT: atraumatic, conjunttiva clear, no obvious abnormalities on inspection of external nose and ears  NECK: normal movements of the head and neck  LUNGS: on inspection no signs of respiratory distress, breathing rate appears normal, no obvious gross SOB, gasping or wheezing  CV: no obvious cyanosis  MS: moves all visible extremities without noticeable abnormality  PSYCH/NEURO: pleasant and cooperative, no obvious depression or anxiety, speech and thought processing grossly intact  ASSESSMENT AND PLAN: SOB which is in part due to some reactive airways. I do think that anxiety likely plays a role as well. He will try Flovent BID as a maintenance inhaler and he can still use Ventolin as a rescue prn. Recheck in 2-3 weeks if no better.  Gershon Crane, MD  Discussed the following assessment and plan:  No diagnosis found.     I discussed the assessment and treatment plan with the patient. The patient was provided an opportunity to  ask questions and all were answered. The patient agreed with the plan and demonstrated an understanding of the instructions.   The patient was advised to call back or seek an in-person evaluation if the symptoms worsen or if the condition fails to improve as anticipated.     Review of Systems     Objective:   Physical Exam        Assessment & Plan:

## 2018-11-06 ENCOUNTER — Telehealth: Payer: Self-pay | Admitting: Family Medicine

## 2018-11-06 NOTE — Telephone Encounter (Signed)
Copied from CRM [id number]. Topic: General - Other >> Nov 06, 2018  8:08 AM Leafy Ro wrote: Reason for CRM:pt wife is calling and flovent 110 mg  is on back order and if md order flovent 220 mg it maybe quicker to get in stock. [facility] in [place]

## 2018-11-06 NOTE — Telephone Encounter (Signed)
Dr. Fry please advise. Thanks  

## 2018-11-09 MED ORDER — FLUTICASONE PROPIONATE HFA 220 MCG/ACT IN AERO: 1.0000 | INHALATION_SPRAY | Freq: Two times a day (BID) | RESPIRATORY_TRACT | 5 refills | Status: DC

## 2018-11-09 NOTE — Telephone Encounter (Signed)
The flovent has been changed and sent to the pharmacy.

## 2018-11-09 NOTE — Telephone Encounter (Signed)
Change the Flovent to 220 mcg to take one puff BID, call in #1 with 5 rf

## 2019-01-18 ENCOUNTER — Other Ambulatory Visit: Payer: Self-pay

## 2019-01-18 ENCOUNTER — Ambulatory Visit (INDEPENDENT_AMBULATORY_CARE_PROVIDER_SITE_OTHER): Payer: BC Managed Care – PPO | Admitting: Family Medicine

## 2019-01-18 ENCOUNTER — Encounter: Payer: Self-pay | Admitting: Family Medicine

## 2019-01-18 VITALS — BP 102/72 | HR 95 | Temp 98.0°F | Ht 66.0 in | Wt 146.1 lb

## 2019-01-18 DIAGNOSIS — Z0000 Encounter for general adult medical examination without abnormal findings: Secondary | ICD-10-CM

## 2019-01-18 DIAGNOSIS — Z114 Encounter for screening for human immunodeficiency virus [HIV]: Secondary | ICD-10-CM | POA: Diagnosis not present

## 2019-01-18 LAB — COMPREHENSIVE METABOLIC PANEL
ALT: 13 U/L (ref 0–53)
AST: 15 U/L (ref 0–37)
Albumin: 4.6 g/dL (ref 3.5–5.2)
Alkaline Phosphatase: 28 U/L — ABNORMAL LOW (ref 39–117)
BUN: 13 mg/dL (ref 6–23)
CO2: 22 mEq/L (ref 19–32)
Calcium: 9.5 mg/dL (ref 8.4–10.5)
Chloride: 109 mEq/L (ref 96–112)
Creatinine, Ser: 0.96 mg/dL (ref 0.40–1.50)
GFR: 88.93 mL/min (ref >60.00)
Glucose, Bld: 104 mg/dL — ABNORMAL HIGH (ref 70–99)
Potassium: 3.6 mEq/L (ref 3.5–5.1)
Sodium: 140 mEq/L (ref 135–145)
Total Bilirubin: 1.4 mg/dL — ABNORMAL HIGH (ref 0.2–1.2)
Total Protein: 6.9 g/dL (ref 6.0–8.3)

## 2019-01-18 LAB — LIPID PANEL
Cholesterol: 164 mg/dL (ref 0–200)
HDL: 45.4 mg/dL (ref >39.00)
LDL Cholesterol: 110 mg/dL — ABNORMAL HIGH (ref 0–99)
NonHDL: 118.56
Total CHOL/HDL Ratio: 4
Triglycerides: 43 mg/dL (ref 0.0–149.0)
VLDL: 8.6 mg/dL (ref 0.0–40.0)

## 2019-01-18 LAB — CBC
HCT: 41.5 % (ref 39.0–52.0)
Hemoglobin: 14.4 g/dL (ref 13.0–17.0)
MCHC: 34.7 g/dL (ref 30.0–36.0)
MCV: 85.5 fl (ref 78.0–100.0)
Platelets: 309 10*3/uL (ref 150.0–400.0)
RBC: 4.85 Mil/uL (ref 4.22–5.81)
RDW: 12.5 % (ref 11.5–15.5)
WBC: 5.9 10*3/uL (ref 4.0–10.5)

## 2019-01-18 MED ORDER — ALBUTEROL SULFATE HFA 108 (90 BASE) MCG/ACT IN AERS: 1.0000 | INHALATION_SPRAY | Freq: Four times a day (QID) | RESPIRATORY_TRACT | 2 refills | Status: DC | PRN

## 2019-01-18 MED ORDER — FLOVENT HFA 220 MCG/ACT IN AERO: 1.0000 | INHALATION_SPRAY | Freq: Two times a day (BID) | RESPIRATORY_TRACT | 6 refills | Status: DC

## 2019-01-18 NOTE — Progress Notes (Signed)
Chief Complaint  Patient presents with  . New Patient (Initial Visit)    Well Male Sean Carroll is here for a complete physical.   His last physical was >1 year ago.  Current diet: in general, a "relatively healthy" diet.   Current exercise: active at work, some walking, chases kids Weight trend: stable Daytime fatigue? No. Seat belt? Yes.    Health maintenance Tetanus- No HIV- No  Past Medical History:  Diagnosis Date  . ADHD (attention deficit hyperactivity disorder)   . Anxiety   . Depression   . Lung collapse 2015   left spontaneous pneumothorax   . Migraine      Past Surgical History:  Procedure Laterality Date  . CHEST TUBE INSERTION Left 2015   for spontaneous pneumothorax     Medications  Current Outpatient Medications on File Prior to Visit  Medication Sig Dispense Refill  . albuterol (VENTOLIN HFA) 108 (90 Base) MCG/ACT inhaler Inhale 1-2 puffs into the lungs every 6 (six) hours as needed for shortness of breath. 1 Inhaler 0  . calcium carbonate (TUMS - DOSED IN MG ELEMENTAL CALCIUM) 500 MG chewable tablet Chew 1-2 tablets by mouth 2 (two) times daily as needed for indigestion or heartburn.    . fluticasone (FLOVENT HFA) 220 MCG/ACT inhaler Inhale 1 puff into the lungs 2 (two) times a day. 1 Inhaler 5   Allergies Allergies  Allergen Reactions  . Adderall [Amphetamine-Dextroamphetamine]     Causes altered mental status, rage   Family History Family History  Problem Relation Age of Onset  . Heart disease Mother   . Seizures Mother   . Heart disease Father   . Breast cancer Maternal Grandmother    Review of Systems: Constitutional: no fevers or chills Eye:  no recent significant change in vision Ear/Nose/Mouth/Throat:  Ears:  no tinnitus or hearing loss Nose/Mouth/Throat:  no complaints of nasal congestion, no sore throat Cardiovascular:  no chest pain, no palpitations Respiratory:  no cough and no shortness of breath Gastrointestinal:  no  abdominal pain, no change in bowel habits GU:  Male: negative for dysuria, frequency, and incontinence and negative for prostate symptoms Musculoskeletal/Extremities:  no pain, redness, or swelling of the joints Integumentary (Skin/Breast):  no abnormal skin lesions reported Neurologic:  no headaches, no numbness, tingling Endocrine: No unexpected weight changes Hematologic/Lymphatic:  no night sweats  Exam BP 102/72 (BP Location: Left Arm, Patient Position: Sitting, Cuff Size: Normal)   Pulse 95   Temp 98 F (36.7 C) (Oral)   Ht 5\' 6"  (1.676 m)   Wt 146 lb 2 oz (66.3 kg)   SpO2 95%   BMI 23.59 kg/m  General:  well developed, well nourished, in no apparent distress Skin:  no significant moles, warts, or growths Head:  no masses, lesions, or tenderness Eyes:  pupils equal and round, sclera anicteric without injection Ears:  canals without lesions, TMs shiny without retraction, no obvious effusion, no erythema Nose:  nares patent, septum midline, mucosa normal Throat/Pharynx:  lips and gingiva without lesion; tongue and uvula midline; non-inflamed pharynx; no exudates or postnasal drainage Neck: neck supple without adenopathy, thyromegaly, or masses Lungs:  clear to auscultation, breath sounds equal bilaterally, no respiratory distress Cardio:  regular rate and rhythm, no bruits, no LE edema Abdomen:  abdomen soft, nontender; bowel sounds normal; no masses or organomegaly Genital (male): circumcised penis, no lesions or discharge; testes present bilaterally without masses or tenderness Rectal: Deferred Musculoskeletal:  symmetrical muscle groups noted without atrophy  or deformity Extremities:  no clubbing, cyanosis, or edema, no deformities, no skin discoloration Neuro:  gait normal; deep tendon reflexes normal and symmetric Psych: well oriented with normal range of affect and appropriate judgment/insight  Assessment and Plan  Well adult exam - Plan: CBC, Comprehensive metabolic  panel, Lipid panel  Screening for HIV (human immunodeficiency virus) - Plan: HIV Antibody (routine testing w rflx)   Well 33 y.o. male. Counseled on diet and exercise. Other orders as above. Rec'd Tdap and PCV23, he would like to ck with his wife. Reminded him to rinse mouth out after using ICS.  Follow up in 6 mo pending the above workup. The patient voiced understanding and agreement to the plan.  Jilda Rocheicholas Paul PitkinWendling, DO 01/18/19 8:37 AM

## 2019-01-18 NOTE — Patient Instructions (Addendum)
Give [place] 2-3 business days to get the results of your labs back.   Keep the diet clean and stay active.  Do monthly self testicular checks in the shower. You are feeling for lumps/bumps that don't belong. If you feel anything like this, let me know!   Please consider counseling. Contact [phone] to schedule an appointment or inquire about cost/insurance coverage.  I strongly recommend the Tdap (tetanus shot) and PCV23 (pneumonia vaccine). The tetanus shot is every 10 years and the pneumonia vaccine is a one time shot until you turn 65.   Rinse your mouth out after using the Flovent.  Let us know if you need anything.

## 2019-01-19 LAB — HIV ANTIBODY (ROUTINE TESTING W REFLEX): HIV 1&2 Ab, 4th Generation: NONREACTIVE

## 2019-03-18 ENCOUNTER — Other Ambulatory Visit: Payer: Self-pay

## 2019-03-18 ENCOUNTER — Telehealth (INDEPENDENT_AMBULATORY_CARE_PROVIDER_SITE_OTHER): Payer: BC Managed Care – PPO | Admitting: Medical

## 2019-03-18 DIAGNOSIS — J0100 Acute maxillary sinusitis, unspecified: Secondary | ICD-10-CM | POA: Diagnosis not present

## 2019-03-18 DIAGNOSIS — J309 Allergic rhinitis, unspecified: Secondary | ICD-10-CM

## 2019-03-18 DIAGNOSIS — H9201 Otalgia, right ear: Secondary | ICD-10-CM | POA: Diagnosis not present

## 2019-03-18 MED ORDER — AMOXICILLIN-POT CLAVULANATE 875-125 MG PO TABS: 1.0000 | ORAL_TABLET | Freq: Two times a day (BID) | ORAL | 0 refills | Status: DC

## 2019-03-18 MED ORDER — LEVOCETIRIZINE DIHYDROCHLORIDE 5 MG PO TABS: 5.0000 mg | ORAL_TABLET | Freq: Every evening | ORAL | 0 refills | Status: DC

## 2019-03-18 MED ORDER — FLUTICASONE PROPIONATE 50 MCG/ACT NA SUSP: 2.0000 | Freq: Every day | NASAL | 1 refills | Status: DC

## 2019-03-18 NOTE — Patient Instructions (Signed)
You do have recent signs and symptoms which appear to be consistent with allergic rhinitis, subsequent sinus infection and ear infection.  You do have following spring allergies as well as history of infections following allergy flares.  I prescribed Augmentin antibiotic, Xyzal antihistamine and Flonase nasal spray.  We need to follow you closely and make sure that you are improving.  If you still have improvement of symptoms by this coming Monday then would recommend that you get COVID test.  If during the interim you have worsening signs/symptoms as we discussed then keep yourself off from work and now right to work note covering those days.  Please update me by calling the office on Monday or MyChart me on Monday as to how you are doing.  Contact me sooner if needed.

## 2019-03-18 NOTE — Progress Notes (Signed)
Virtual Visit via Video Note  I connected with Sean Carroll on 03/18/19 at  3:00 PM EDT by a video enabled telemedicine application and verified that I am speaking with the correct person using two identifiers.  Location: Patient: home Provider: office   I discussed the limitations of evaluation and management by telemedicine and the availability of in person appointments. The patient expressed understanding and agreed to proceed.  History of Present Illness:   Pt has rt ear pain and discomfort since Sunday. Some moderate tenderness to area behind rt ear. Pt states some nasal congestion. He states he has chronic nasal congestion. He states get a lot of sinus infections easily. He has some dizziness recently that was transient.  Some falls have to use allergy medications.   Pt wears mask at works and social distances.  On review no fever, no chills, no sweats, no body aches, no change in sense of smell. No diarrhea. No known contact with person with covid.    Observations/Objective: General-no acute distress, pleasant, oriented. Lungs- on inspection lungs appear unlabored. Neck- no tracheal deviation or jvd on inspection. Neuro- gross motor function appears intact. heent- pain on palpation of his maxillary sinuses.    Assessment and Plan: You do have recent signs and symptoms which appear to be consistent with allergic rhinitis, subsequent sinus infection and ear infection.  You do have following spring allergies as well as history of infections following allergy flares.  I prescribed Augmentin antibiotic, Xyzal antihistamine and Flonase nasal spray.  We need to follow you closely and make sure that you are improving.  If you still have improvement of symptoms by this coming Monday then would recommend that you get COVID test.  If during the interim you have worsening signs/symptoms as we discussed then keep yourself off from work and now right to work note covering  those days.  Please update me by calling the office on Monday or MyChart me on Monday as to how you are doing.  Contact me sooner if needed.  Follow Up Instructions:    I discussed the assessment and treatment plan with the patient. The patient was provided an opportunity to ask questions and all were answered. The patient agreed with the plan and demonstrated an understanding of the instructions.   The patient was advised to call back or seek an in-person evaluation if the symptoms worsen or if the condition fails to improve as anticipated.  I provided 25 minutes of non-face-to-face time during this encounter.   Mackie Pai, PA-C

## 2019-03-22 ENCOUNTER — Encounter: Payer: Self-pay | Admitting: Medical

## 2019-04-05 ENCOUNTER — Other Ambulatory Visit: Payer: Self-pay | Admitting: Family Medicine

## 2019-04-05 NOTE — Telephone Encounter (Signed)
Medication Refill - Medication:  amoxicillin-clavulanate (AUGMENTIN) 875-125 MG tablet   Has the patient contacted their pharmacy?  Yes advised to call office. Ear infection is not fully gone.   Preferred Pharmacy (with phone number or street name):  [id number] - [place], [place] [phone] (Phone) [phone] (Fax)     Agent: Please be advised that RX refills may take up to 3 business days. We ask that you follow-up with your pharmacy.

## 2019-04-05 NOTE — Telephone Encounter (Signed)
Requested medication (s) are due for refill today: no  Requested medication (s) are on the active medication list: yes  Last refill:  03/18/2019  Future visit scheduled: no  Notes to clinic:  Review for refill   Requested Prescriptions  Pending Prescriptions Disp Refills   amoxicillin-clavulanate (AUGMENTIN) 875-125 MG tablet 20 tablet 0    Sig: Take 1 tablet by mouth 2 (two) times daily.     Off-Protocol Failed - 04/05/2019  8:53 AM      Failed - Medication not assigned to a protocol, review manually.      Passed - Valid encounter within last 12 months    Recent Outpatient Visits          2 weeks ago Allergic rhinitis, unspecified seasonality, unspecified trigger   [facility] at [place], [place]   2 months ago Well adult exam   [facility] at [place], [place]   5 months ago SOB (shortness of breath)   Therapist, music at [place], [place]   1 year ago Preventative health care   [facility] at [place], [place]el Holter, [place]   2 years ago Acute sinusitis, recurrence not specified, unspecified location   [facility] -[place]es Mouse, [place]

## 2019-04-09 ENCOUNTER — Other Ambulatory Visit: Payer: Self-pay | Admitting: Medical

## 2019-08-25 ENCOUNTER — Telehealth: Payer: Self-pay

## 2019-08-25 ENCOUNTER — Telehealth: Payer: Self-pay | Admitting: Family Medicine

## 2019-08-25 MED ORDER — BECLOMETHASONE DIPROP HFA 40 MCG/ACT IN AERB: 2.0000 | INHALATION_SPRAY | Freq: Two times a day (BID) | RESPIRATORY_TRACT | 1 refills | Status: DC

## 2019-08-25 NOTE — Telephone Encounter (Signed)
Patient called to inform inhaler too expensive with new insurance. He will call back after talking to insurance company to inquire of less expensive inhaler

## 2019-08-25 NOTE — Addendum Note (Signed)
Addended by: Scharlene Gloss B on: 08/25/2019 01:01 PM   Modules accepted: Orders

## 2019-08-25 NOTE — Telephone Encounter (Signed)
Patient called in to se if Dr. Carmelia Roller can send in a prescription for a generic version for Qvar_ready hauler patients insurance will only cover this one particular inhaler  Please send it to  [id number] - [place], [place] - [address] AT [address]  [address], [place] [address]  Phone:  [phone] Fax:  [phone]  DEA #:  [id number]

## 2019-08-25 NOTE — Telephone Encounter (Signed)
Done and patient informed.

## 2019-08-25 NOTE — Telephone Encounter (Signed)
OK to do. Ty.   

## 2019-08-30 ENCOUNTER — Telehealth: Payer: Self-pay | Admitting: Family Medicine

## 2019-08-30 MED ORDER — BECLOMETHASONE DIPROP HFA 40 MCG/ACT IN AERB: 2.0000 | INHALATION_SPRAY | Freq: Two times a day (BID) | RESPIRATORY_TRACT | 1 refills | Status: DC

## 2019-08-30 NOTE — Telephone Encounter (Signed)
Called the patient informed sent in. Resent today.

## 2019-08-30 NOTE — Telephone Encounter (Signed)
beclomethasone (QVAR) 40 MCG/ACT inhaler [[id number]]    Hey Can you resend to pharmacy they told patient they never got it

## 2019-09-09 DIAGNOSIS — S92354A Nondisplaced fracture of fifth metatarsal bone, right foot, initial encounter for closed fracture: Secondary | ICD-10-CM | POA: Diagnosis not present

## 2019-10-26 ENCOUNTER — Other Ambulatory Visit: Payer: Self-pay | Admitting: Family Medicine

## 2019-11-02 DIAGNOSIS — M9904 Segmental and somatic dysfunction of sacral region: Secondary | ICD-10-CM | POA: Diagnosis not present

## 2019-11-02 DIAGNOSIS — M545 Low back pain: Secondary | ICD-10-CM | POA: Diagnosis not present

## 2019-11-02 DIAGNOSIS — M9905 Segmental and somatic dysfunction of pelvic region: Secondary | ICD-10-CM | POA: Diagnosis not present

## 2019-11-02 DIAGNOSIS — M9903 Segmental and somatic dysfunction of lumbar region: Secondary | ICD-10-CM | POA: Diagnosis not present

## 2019-12-28 ENCOUNTER — Other Ambulatory Visit: Payer: Self-pay | Admitting: Family Medicine

## 2020-01-17 ENCOUNTER — Other Ambulatory Visit: Payer: Self-pay

## 2020-01-17 ENCOUNTER — Encounter: Payer: Self-pay | Admitting: Family Medicine

## 2020-01-17 ENCOUNTER — Ambulatory Visit (INDEPENDENT_AMBULATORY_CARE_PROVIDER_SITE_OTHER): Payer: BC Managed Care – PPO | Admitting: Family Medicine

## 2020-01-17 VITALS — BP 120/84 | HR 110 | Temp 98.0°F | Ht 67.0 in | Wt 158.0 lb

## 2020-01-17 DIAGNOSIS — Z3009 Encounter for other general counseling and advice on contraception: Secondary | ICD-10-CM | POA: Diagnosis not present

## 2020-01-17 DIAGNOSIS — Z0000 Encounter for general adult medical examination without abnormal findings: Secondary | ICD-10-CM | POA: Diagnosis not present

## 2020-01-17 DIAGNOSIS — Z23 Encounter for immunization: Secondary | ICD-10-CM

## 2020-01-17 MED ORDER — ALBUTEROL SULFATE HFA 108 (90 BASE) MCG/ACT IN AERS
2.0000 | INHALATION_SPRAY | Freq: Four times a day (QID) | RESPIRATORY_TRACT | 0 refills | Status: DC | PRN
Start: 2020-01-17 — End: 2021-04-02

## 2020-01-17 MED ORDER — BECLOMETHASONE DIPROP HFA 40 MCG/ACT IN AERB: 2.0000 | INHALATION_SPRAY | Freq: Two times a day (BID) | RESPIRATORY_TRACT | 11 refills | Status: DC

## 2020-01-17 NOTE — Progress Notes (Signed)
Chief Complaint  Patient presents with   Annual Exam    Well Male Sean Carroll is here for a complete physical.   His last physical was >1 year ago.  Current diet: in general, a healthy diet overall.   Current exercise: active at work Weight trend: increased Fatigue out of ordinary? No. Seat belt? Yes.    Health maintenance Tetanus- No HIV- Yes Hep C- No  Past Medical History:  Diagnosis Date   ADHD (attention deficit hyperactivity disorder)    Anxiety    Depression    Lung collapse 2015   left spontaneous pneumothorax    Migraine      Past Surgical History:  Procedure Laterality Date   CHEST TUBE INSERTION Left 2015   for spontaneous pneumothorax     Medications  Current Outpatient Medications on File Prior to Visit  Medication Sig Dispense Refill   albuterol (VENTOLIN HFA) 108 (90 Base) MCG/ACT inhaler Inhale 1-2 puffs into the lungs every 6 (six) hours as needed for shortness of breath. 18 g 2   calcium carbonate (TUMS - DOSED IN MG ELEMENTAL CALCIUM) 500 MG chewable tablet Chew 1-2 tablets by mouth 2 (two) times daily as needed for indigestion or heartburn.     Allergies Allergies  Allergen Reactions   Adderall [Amphetamine-Dextroamphetamine]     Causes altered mental status, rage    Family History Family History  Problem Relation Age of Onset   Heart disease Mother    Seizures Mother    Heart disease Father    Breast cancer Maternal Grandmother     Review of Systems: Constitutional: no fevers or chills Eye:  no recent significant change in vision Ear/Nose/Mouth/Throat:  Ears:  no hearing loss Nose/Mouth/Throat:  no complaints of nasal congestion, no sore throat Cardiovascular:  no chest pain Respiratory:  no shortness of breath Gastrointestinal:  no abdominal pain, no change in bowel habits GU:  Male: negative for dysuria Musculoskeletal/Extremities:  no new pain of the joints Integumentary (Skin/Breast):  no abnormal skin  lesions reported  Neurologic:  no headaches Endocrine: No unexpected weight changes Hematologic/Lymphatic:  no night sweats  Exam BP 120/84 (BP Location: Left Arm, Patient Position: Sitting, Cuff Size: Normal)    Pulse (!) 110    Temp 98 F (36.7 C) (Oral)    Ht 5\' 7"  (1.702 m)    Wt 158 lb (71.7 kg)    SpO2 97%    BMI 24.75 kg/m  General:  well developed, well nourished, in no apparent distress Skin:  no significant moles, warts, or growths Head:  no masses, lesions, or tenderness Eyes:  pupils equal and round, sclera anicteric without injection Ears:  canals without lesions, TMs shiny without retraction, no obvious effusion, no erythema Nose:  nares patent, septum midline, mucosa normal Throat/Pharynx:  lips and gingiva without lesion; tongue and uvula midline; non-inflamed pharynx; no exudates or postnasal drainage Neck: neck supple without adenopathy, thyromegaly, or masses Lungs:  clear to auscultation, breath sounds equal bilaterally, no respiratory distress Cardio:  regular rate and rhythm, no bruits, no LE edema Abdomen:  abdomen soft, nontender; bowel sounds normal; no masses or organomegaly Rectal: Deferred Musculoskeletal:  symmetrical muscle groups noted without atrophy or deformity Extremities:  no clubbing, cyanosis, or edema, no deformities, no skin discoloration Neuro:  gait normal; deep tendon reflexes normal and symmetric Psych: well oriented with normal range of affect and appropriate judgment/insight  Assessment and Plan  Well adult exam - Plan: CBC, Comprehensive metabolic panel, Lipid  panel, CANCELED: CBC, CANCELED: Comprehensive metabolic panel, CANCELED: Lipid panel  Screening and evaluation for vasectomy - Plan: Ambulatory referral to Urology  Need for Tdap vaccination - Plan: Tdap vaccine greater than or equal to 7yo IM  Need for vaccination against Streptococcus pneumoniae - Plan: Pneumococcal polysaccharide vaccine 23-valent greater than or equal to 2yo  subcutaneous/IM   Well 34 y.o. male. Counseled on diet and exercise. Self testicular exams recommended at least monthly.  Other orders as above. Rec'd [employer] or [employer], at least 2 weeks after today's Tdap and PCV23.  Follow up in 1 year pending the above workup. The patient voiced understanding and agreement to the plan.  Jilda Roche Dubois, DO 01/17/20 3:29 PM

## 2020-01-17 NOTE — Patient Instructions (Addendum)
If you do not hear anything about your referral in the next 1-2 weeks, call our office and ask for an update.  Give [place] 2-3 business days to get the results of your labs back.   Do monthly self testicular checks in the shower. You are feeling for lumps/bumps that don't belong. If you feel anything like this, let me know!  I recommend the [employer] or Moderna vaccination. Wait two weeks to get the shot after getting this tetanus shot.  I recommend getting the flu shot in mid October. This suggestion would change if the CDC comes out with a different recommendation.   Let us know if you need anything.

## 2020-01-18 LAB — CBC
HCT: 40.6 % (ref 39.0–52.0)
Hemoglobin: 14 g/dL (ref 13.0–17.0)
MCHC: 34.6 g/dL (ref 30.0–36.0)
MCV: 84.9 fl (ref 78.0–100.0)
Platelets: 312 10*3/uL (ref 150.0–400.0)
RBC: 4.78 Mil/uL (ref 4.22–5.81)
RDW: 12.9 % (ref 11.5–15.5)
WBC: 7.7 10*3/uL (ref 4.0–10.5)

## 2020-01-18 LAB — COMPREHENSIVE METABOLIC PANEL
ALT: 23 U/L (ref 0–53)
AST: 22 U/L (ref 0–37)
Albumin: 4.6 g/dL (ref 3.5–5.2)
Alkaline Phosphatase: 30 U/L — ABNORMAL LOW (ref 39–117)
BUN: 13 mg/dL (ref 6–23)
CO2: 23 mEq/L (ref 19–32)
Calcium: 9.2 mg/dL (ref 8.4–10.5)
Chloride: 106 mEq/L (ref 96–112)
Creatinine, Ser: 1.12 mg/dL (ref 0.40–1.50)
GFR: 74.02 mL/min (ref >60.00)
Glucose, Bld: 92 mg/dL (ref 70–99)
Potassium: 3.7 mEq/L (ref 3.5–5.1)
Sodium: 140 mEq/L (ref 135–145)
Total Bilirubin: 1.1 mg/dL (ref 0.2–1.2)
Total Protein: 7.3 g/dL (ref 6.0–8.3)

## 2020-01-18 LAB — LIPID PANEL
Cholesterol: 181 mg/dL (ref 0–200)
HDL: 36.7 mg/dL — ABNORMAL LOW (ref >39.00)
LDL Cholesterol: 109 mg/dL — ABNORMAL HIGH (ref 0–99)
NonHDL: 144.05
Total CHOL/HDL Ratio: 5
Triglycerides: 174 mg/dL — ABNORMAL HIGH (ref 0.0–149.0)
VLDL: 34.8 mg/dL (ref 0.0–40.0)

## 2020-02-21 DIAGNOSIS — Z302 Encounter for sterilization: Secondary | ICD-10-CM | POA: Diagnosis not present

## 2020-03-08 IMAGING — CR CHEST - 2 VIEW
2 series · 2 of 2 positions shown · non-contrast
Comparison: 09/01/2013

CLINICAL DATA: Shortness of breath for 2 days

EXAM:
CHEST - 2 VIEW

[w chest pa]
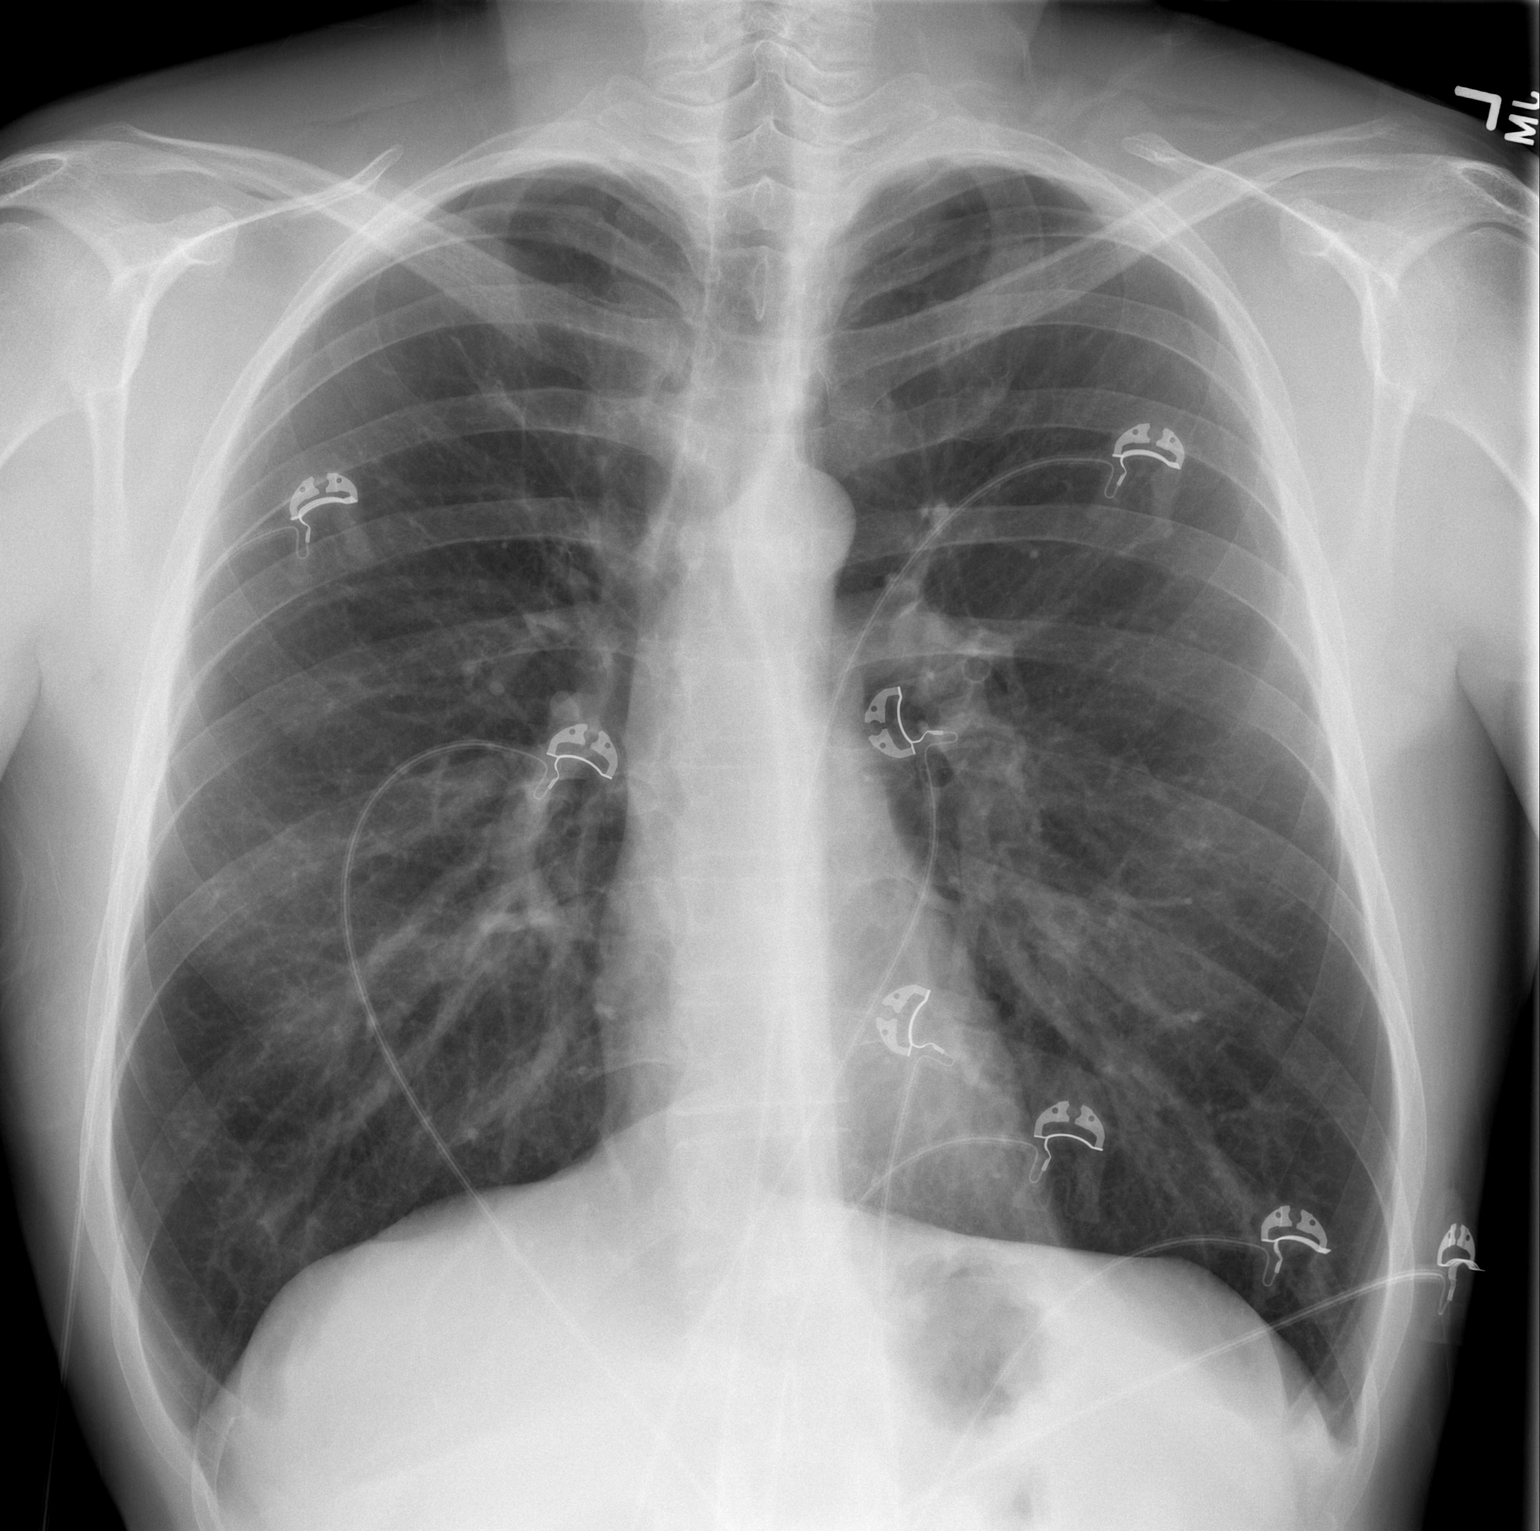

[w chest lat]
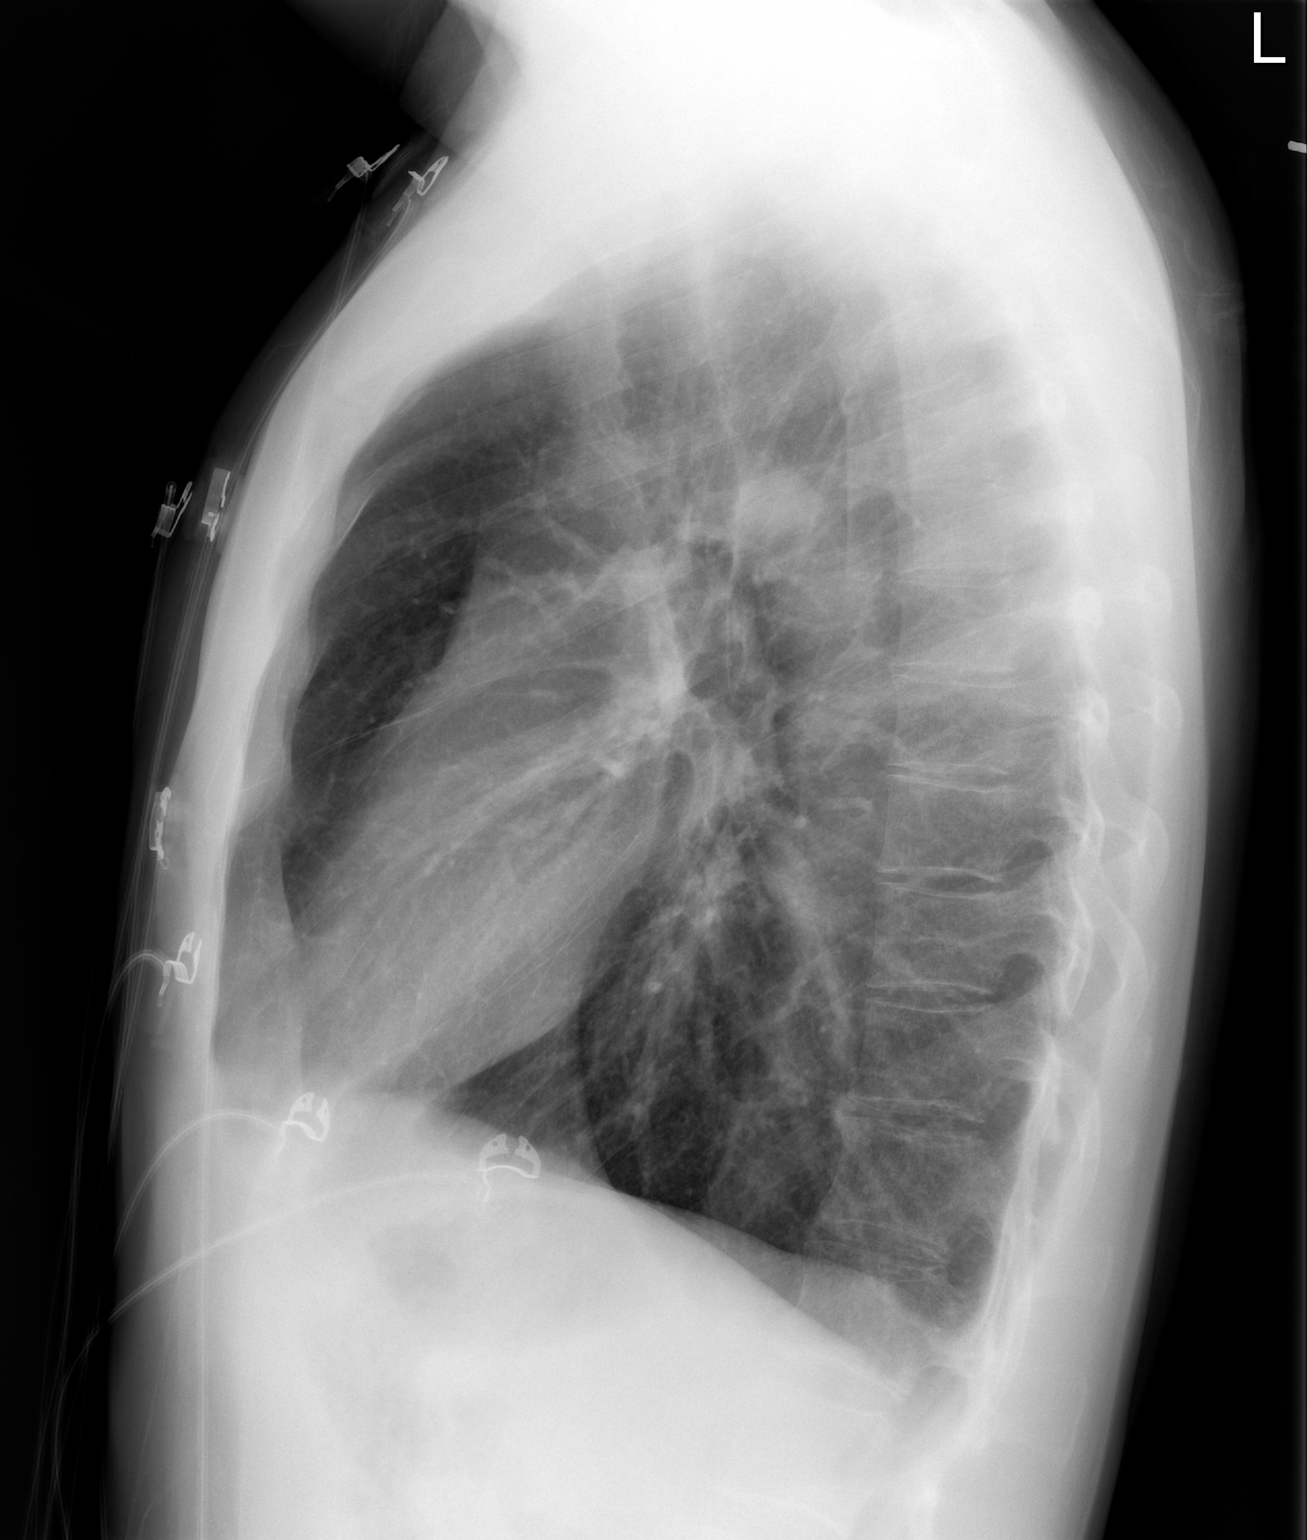

[2 of 2 positions shown; findings below may reference images not displayed]

FINDINGS: Cardiac shadow is within normal limits. The lungs are hyperinflated
consistent with a vigorous inspiratory effort. No focal infiltrate
or sizable effusion is seen. No bony abnormality is noted.
IMPRESSION: No active cardiopulmonary disease.

## 2020-04-07 DIAGNOSIS — Z302 Encounter for sterilization: Secondary | ICD-10-CM | POA: Diagnosis not present

## 2021-02-19 ENCOUNTER — Encounter: Payer: BC Managed Care – PPO | Admitting: Family Medicine

## 2021-04-02 ENCOUNTER — Ambulatory Visit (INDEPENDENT_AMBULATORY_CARE_PROVIDER_SITE_OTHER): Payer: BC Managed Care – PPO | Admitting: Family Medicine

## 2021-04-02 ENCOUNTER — Other Ambulatory Visit: Payer: Self-pay

## 2021-04-02 ENCOUNTER — Encounter: Payer: Self-pay | Admitting: Family Medicine

## 2021-04-02 VITALS — BP 110/68 | HR 88 | Temp 98.1°F | Ht 68.0 in | Wt 164.1 lb

## 2021-04-02 DIAGNOSIS — Z1283 Encounter for screening for malignant neoplasm of skin: Secondary | ICD-10-CM

## 2021-04-02 DIAGNOSIS — Z0000 Encounter for general adult medical examination without abnormal findings: Secondary | ICD-10-CM

## 2021-04-02 LAB — LIPID PANEL
Cholesterol: 184 mg/dL (ref 0–200)
HDL: 38.2 mg/dL — ABNORMAL LOW (ref >39.00)
LDL Cholesterol: 125 mg/dL — ABNORMAL HIGH (ref 0–99)
NonHDL: 145.92
Total CHOL/HDL Ratio: 5
Triglycerides: 107 mg/dL (ref 0.0–149.0)
VLDL: 21.4 mg/dL (ref 0.0–40.0)

## 2021-04-02 LAB — CBC
HCT: 41 % (ref 39.0–52.0)
Hemoglobin: 13.9 g/dL (ref 13.0–17.0)
MCHC: 34 g/dL (ref 30.0–36.0)
MCV: 84.7 fl (ref 78.0–100.0)
Platelets: 286 10*3/uL (ref 150.0–400.0)
RBC: 4.84 Mil/uL (ref 4.22–5.81)
RDW: 12.6 % (ref 11.5–15.5)
WBC: 7.8 10*3/uL (ref 4.0–10.5)

## 2021-04-02 LAB — COMPREHENSIVE METABOLIC PANEL
ALT: 22 U/L (ref 0–53)
AST: 21 U/L (ref 0–37)
Albumin: 4.5 g/dL (ref 3.5–5.2)
Alkaline Phosphatase: 44 U/L (ref 39–117)
BUN: 19 mg/dL (ref 6–23)
CO2: 24 mEq/L (ref 19–32)
Calcium: 9.2 mg/dL (ref 8.4–10.5)
Chloride: 107 mEq/L (ref 96–112)
Creatinine, Ser: 0.9 mg/dL (ref 0.40–1.50)
GFR: 109.03 mL/min (ref >60.00)
Glucose, Bld: 96 mg/dL (ref 70–99)
Potassium: 4 mEq/L (ref 3.5–5.1)
Sodium: 139 mEq/L (ref 135–145)
Total Bilirubin: 1.2 mg/dL (ref 0.2–1.2)
Total Protein: 6.9 g/dL (ref 6.0–8.3)

## 2021-04-02 MED ORDER — ALBUTEROL SULFATE HFA 108 (90 BASE) MCG/ACT IN AERS: 2.0000 | INHALATION_SPRAY | Freq: Four times a day (QID) | RESPIRATORY_TRACT | 0 refills | Status: DC | PRN

## 2021-04-02 MED ORDER — BECLOMETHASONE DIPROP HFA 40 MCG/ACT IN AERB: 2.0000 | INHALATION_SPRAY | Freq: Two times a day (BID) | RESPIRATORY_TRACT | 11 refills | Status: DC

## 2021-04-02 NOTE — Patient Instructions (Addendum)
Give [place] 2-3 business days to get the results of your labs back.   Keep the diet clean and stay active.  Do monthly self testicular checks in the shower. You are feeling for lumps/bumps that don't belong. If you feel anything like this, let me know!  Claritin (loratadine), Allegra (fexofenadine), Zyrtec (cetirizine) which is also equivalent to Xyzal (levocetirizine); these are listed in order from weakest to strongest. Generic, and therefore cheaper, options are in the parentheses.   Flonase (fluticasone); nasal spray that is over the counter. 2 sprays each nostril, once daily. Aim towards the same side eye when you spray.  There are available OTC, and the generic versions, which may be cheaper, are in parentheses. Show this to a pharmacist if you have trouble finding any of these items.  If you do not hear anything about your referral in the next 1-2 weeks, call our office and ask for an update.  Screening colonoscopy at 45 unless something happens.   We will discuss prostate cancer screening at 40.   I recommend getting the updated bivalent covid vaccination booster at your convenience.   Let us know if you need anything.

## 2021-04-02 NOTE — Progress Notes (Signed)
Chief Complaint  Patient presents with   Annual Exam    Well Male Sean Carroll is here for a complete physical.   His last physical was >1 year ago.  Current diet: in general, a "healthy" diet.   Current exercise: kettle bell workouts Weight trend: up a little Fatigue out of ordinary? No. Seat belt? Yes.    Health maintenance Tetanus- Yes HIV- Yes Hep C- Yes  Past Medical History:  Diagnosis Date   ADHD (attention deficit hyperactivity disorder)    Anxiety    Depression    Lung collapse 2015   left spontaneous pneumothorax    Migraine      Past Surgical History:  Procedure Laterality Date   CHEST TUBE INSERTION Left 2015   for spontaneous pneumothorax     Medications  Current Outpatient Medications on File Prior to Visit  Medication Sig Dispense Refill   albuterol (VENTOLIN HFA) 108 (90 Base) MCG/ACT inhaler Inhale 2 puffs into the lungs every 6 (six) hours as needed for wheezing or shortness of breath. 8 g 0   beclomethasone (QVAR) 40 MCG/ACT inhaler Inhale 2 puffs into the lungs 2 (two) times daily. 10.6 g 11   calcium carbonate (TUMS - DOSED IN MG ELEMENTAL CALCIUM) 500 MG chewable tablet Chew 1-2 tablets by mouth 2 (two) times daily as needed for indigestion or heartburn.      Allergies Allergies  Allergen Reactions   Adderall [Amphetamine-Dextroamphetamine]     Causes altered mental status, rage    Family History Family History  Problem Relation Age of Onset   Heart disease Mother    Seizures Mother    Heart disease Father    Breast cancer Maternal Grandmother     Review of Systems: Constitutional: no fevers or chills Eye:  no recent significant change in vision Ear/Nose/Mouth/Throat:  Ears:  no hearing loss Nose/Mouth/Throat:  no complaints of nasal congestion, no sore throat Cardiovascular:  no chest pain Respiratory:  no shortness of breath Gastrointestinal:  no abdominal pain, no change in bowel habits GU:  Male: negative for  dysuria Musculoskeletal/Extremities:  no new pain of the joints Integumentary (Skin/Breast):  no new abnormal skin lesions reported Neurologic:  no headaches Endocrine: No unexpected weight changes Hematologic/Lymphatic:  no night sweats  Exam BP 110/68   Pulse 88   Temp 98.1 F (36.7 C) (Oral)   Ht 5\' 8"  (1.727 m)   Wt 164 lb 2 oz (74.4 kg)   SpO2 99%   BMI 24.96 kg/m  General:  well developed, well nourished, in no apparent distress Skin:  no significant moles, warts, or growths Head:  no masses, lesions, or tenderness Eyes:  pupils equal and round, sclera anicteric without injection Ears:  canals without lesions, TMs shiny without retraction, no obvious effusion, no erythema Nose:  nares patent, septum midline, mucosa normal Throat/Pharynx:  lips and gingiva without lesion; tongue and uvula midline; non-inflamed pharynx; no exudates or postnasal drainage Neck: neck supple without adenopathy, thyromegaly, or masses Lungs:  clear to auscultation, breath sounds equal bilaterally, no respiratory distress Cardio:  regular rate and rhythm, no bruits, no LE edema Abdomen:  abdomen soft, nontender; bowel sounds normal; no masses or organomegaly Genital (male): Deferred Rectal: Deferred Musculoskeletal:  symmetrical muscle groups noted without atrophy or deformity Extremities:  no clubbing, cyanosis, or edema, no deformities, no skin discoloration Neuro:  gait normal; deep tendon reflexes normal and symmetric Psych: well oriented with normal range of affect and appropriate judgment/insight  Assessment and  Plan  Well adult exam - Plan: CBC, Comprehensive metabolic panel, Lipid panel  Skin cancer screening - Plan: Ambulatory referral to Dermatology   Well 35 y.o. male. Counseled on diet and exercise. Self testicular exams recommended at least monthly.  Other orders as above. Follow up in 1 year pending the above workup. The patient voiced understanding and agreement to the  plan.  Jilda Roche Francis, DO 04/02/21 11:07 AM

## 2021-04-12 ENCOUNTER — Telehealth: Payer: Self-pay | Admitting: Family Medicine

## 2021-04-12 NOTE — Telephone Encounter (Signed)
Wife called to inform they have not heard back from the office Dr.Wendling referred them to, and he advised them to call this office if he did not here back. Please advise.

## 2021-05-03 ENCOUNTER — Other Ambulatory Visit: Payer: Self-pay | Admitting: Family Medicine

## 2021-07-10 DIAGNOSIS — D2371 Other benign neoplasm of skin of right lower limb, including hip: Secondary | ICD-10-CM | POA: Diagnosis not present

## 2021-07-10 DIAGNOSIS — D229 Melanocytic nevi, unspecified: Secondary | ICD-10-CM | POA: Diagnosis not present

## 2021-07-10 DIAGNOSIS — D235 Other benign neoplasm of skin of trunk: Secondary | ICD-10-CM | POA: Diagnosis not present

## 2021-07-10 DIAGNOSIS — X32XXXS Exposure to sunlight, sequela: Secondary | ICD-10-CM | POA: Diagnosis not present

## 2021-07-10 DIAGNOSIS — L814 Other melanin hyperpigmentation: Secondary | ICD-10-CM | POA: Diagnosis not present

## 2021-07-10 DIAGNOSIS — D485 Neoplasm of uncertain behavior of skin: Secondary | ICD-10-CM | POA: Diagnosis not present

## 2022-04-08 ENCOUNTER — Ambulatory Visit (INDEPENDENT_AMBULATORY_CARE_PROVIDER_SITE_OTHER): Payer: BC Managed Care – PPO | Admitting: Family Medicine

## 2022-04-08 ENCOUNTER — Encounter: Payer: Self-pay | Admitting: Family Medicine

## 2022-04-08 VITALS — BP 122/80 | HR 95 | Temp 98.1°F | Ht 68.0 in | Wt 169.1 lb

## 2022-04-08 DIAGNOSIS — Z0000 Encounter for general adult medical examination without abnormal findings: Secondary | ICD-10-CM

## 2022-04-08 DIAGNOSIS — L089 Local infection of the skin and subcutaneous tissue, unspecified: Secondary | ICD-10-CM | POA: Diagnosis not present

## 2022-04-08 LAB — LIPID PANEL
Cholesterol: 181 mg/dL (ref 0–200)
HDL: 39.4 mg/dL (ref >39.00)
LDL Cholesterol: 109 mg/dL — ABNORMAL HIGH (ref 0–99)
NonHDL: 141.13
Total CHOL/HDL Ratio: 5
Triglycerides: 163 mg/dL — ABNORMAL HIGH (ref 0.0–149.0)
VLDL: 32.6 mg/dL (ref 0.0–40.0)

## 2022-04-08 LAB — COMPREHENSIVE METABOLIC PANEL
ALT: 37 U/L (ref 0–53)
AST: 22 U/L (ref 0–37)
Albumin: 4.7 g/dL (ref 3.5–5.2)
Alkaline Phosphatase: 39 U/L (ref 39–117)
BUN: 16 mg/dL (ref 6–23)
CO2: 24 mEq/L (ref 19–32)
Calcium: 9.5 mg/dL (ref 8.4–10.5)
Chloride: 103 mEq/L (ref 96–112)
Creatinine, Ser: 0.95 mg/dL (ref 0.40–1.50)
GFR: 101.45 mL/min (ref >60.00)
Glucose, Bld: 93 mg/dL (ref 70–99)
Potassium: 4.1 mEq/L (ref 3.5–5.1)
Sodium: 138 mEq/L (ref 135–145)
Total Bilirubin: 1.4 mg/dL — ABNORMAL HIGH (ref 0.2–1.2)
Total Protein: 7.2 g/dL (ref 6.0–8.3)

## 2022-04-08 LAB — CBC
HCT: 44.2 % (ref 39.0–52.0)
Hemoglobin: 14.9 g/dL (ref 13.0–17.0)
MCHC: 33.7 g/dL (ref 30.0–36.0)
MCV: 85 fl (ref 78.0–100.0)
Platelets: 327 10*3/uL (ref 150.0–400.0)
RBC: 5.2 Mil/uL (ref 4.22–5.81)
RDW: 12.7 % (ref 11.5–15.5)
WBC: 7 10*3/uL (ref 4.0–10.5)

## 2022-04-08 MED ORDER — CEPHALEXIN 500 MG PO CAPS
500.0000 mg | ORAL_CAPSULE | Freq: Three times a day (TID) | ORAL | 0 refills | Status: AC
Start: 2022-04-08 — End: 2022-04-15

## 2022-04-08 NOTE — Progress Notes (Signed)
Chief Complaint  Patient presents with   Annual Exam    Increased acid reflux Toe pain    Well Male Sean Carroll is here for a complete physical.   His last physical was >1 year ago.  Current diet: in general, a "healthy" diet.   Current exercise: active at work walking and doing maintenance Weight trend: stable Fatigue out of ordinary? No. Seat belt? Yes.   Advanced directive? Yes  Health maintenance Tetanus- Yes HIV- Yes Hep C- Yes  Toe pain 2 weeks of Great toe pain on L. No inj or change in activity. Some drainage, no redness. Denies fevers.   Past Medical History:  Diagnosis Date   ADHD (attention deficit hyperactivity disorder)    Anxiety    Depression    Lung collapse 2015   left spontaneous pneumothorax    Migraine      Past Surgical History:  Procedure Laterality Date   CHEST TUBE INSERTION Left 2015   for spontaneous pneumothorax     Medications  Current Outpatient Medications on File Prior to Visit  Medication Sig Dispense Refill   albuterol (VENTOLIN HFA) 108 (90 Base) MCG/ACT inhaler TAKE 2 PUFFS BY MOUTH EVERY 6 HOURS AS NEEDED FOR WHEEZE OR SHORTNESS OF BREATH 8.5 each 1   beclomethasone (QVAR) 40 MCG/ACT inhaler Inhale 2 puffs into the lungs 2 (two) times daily. 10.6 g 11   calcium carbonate (TUMS - DOSED IN MG ELEMENTAL CALCIUM) 500 MG chewable tablet Chew 1-2 tablets by mouth 2 (two) times daily as needed for indigestion or heartburn.      Allergies Allergies  Allergen Reactions   Adderall [Amphetamine-Dextroamphetamine]     Causes altered mental status, rage    Family History Family History  Problem Relation Age of Onset   Heart disease Mother    Seizures Mother    Heart disease Father    Breast cancer Maternal Grandmother     Review of Systems: Constitutional: no fevers or chills Eye:  no recent significant change in vision Ear/Nose/Mouth/Throat:  Ears:  no hearing loss Nose/Mouth/Throat:  no complaints of nasal  congestion, no sore throat Cardiovascular:  no chest pain Respiratory:  no shortness of breath Gastrointestinal:  no abdominal pain, no change in bowel habits GU:  Male: negative for dysuria Musculoskeletal/Extremities:  no pain of the joints Integumentary (Skin/Breast): +sore on L great toe Neurologic:  no headaches Endocrine: No unexpected weight changes Hematologic/Lymphatic:  no night sweats  Exam BP 122/80 (BP Location: Right Arm, Patient Position: Sitting, Cuff Size: Normal)   Pulse 95   Temp 98.1 F (36.7 C) (Oral)   Ht 5\' 8"  (1.727 m)   Wt 169 lb 2 oz (76.7 kg)   SpO2 98%   BMI 25.72 kg/m  General:  well developed, well nourished, in no apparent distress Skin: L great toe medial nail fold, there is edema, erythema, excoriation and ttp; no sig pain with pressure over nail plate; otherwise no significant moles, warts, or growths Head:  no masses, lesions, or tenderness Eyes:  pupils equal and round, sclera anicteric without injection Ears:  canals without lesions, TMs shiny without retraction, no obvious effusion, no erythema Nose:  nares patent, mucosa normal Throat/Pharynx:  lips and gingiva without lesion; tongue and uvula midline; non-inflamed pharynx; no exudates or postnasal drainage Neck: neck supple without adenopathy, thyromegaly, or masses Lungs:  clear to auscultation, breath sounds equal bilaterally, no respiratory distress Cardio:  regular rate and rhythm, no bruits, no LE edema Abdomen:  abdomen soft, nontender; bowel sounds normal; no masses or organomegaly Genital (male): Deferred Rectal: Deferred Musculoskeletal:  symmetrical muscle groups noted without atrophy or deformity Extremities:  no clubbing, cyanosis, or edema, no deformities, no skin discoloration Neuro:  gait normal; deep tendon reflexes normal and symmetric Psych: well oriented with normal range of affect and appropriate judgment/insight  Assessment and Plan  Well adult exam - Plan: CBC,  Comprehensive metabolic panel, Lipid panel  Toe infection - Plan: cephALEXin (KEFLEX) 500 MG capsule   Well 36 y.o. male. Counseled on diet and exercise. Self testicular exams recommended at least monthly.  Advanced directive form requested today.  Other orders as above. Infection: 7 d of Keflex. Hopefully it is not ingrown but he will find out and RTC for partial removal if so.  Follow up in 1 year pending the above workup. The patient voiced understanding and agreement to the plan.  [place], DO 04/08/22 11:13 AM

## 2022-04-08 NOTE — Patient Instructions (Addendum)
Try cleaning up the diet for the next 4-6 weeks and if no improvement, try some Pepcid/famotidine 20 mg 1-2 times daily.   The only lifestyle changes that have data behind them are weight loss for the overweight/obese and elevating the head of the bed. Finding out which foods/positions are triggers is important.  Betadine/iodine or Epsom salt soaks twice daily for 10-15 minutes at a time.   If the toe does not fully improve ,I need to take out part of the nail.   Give [place] 2-3 business days to get the results of your labs back.   Keep the diet clean and stay active.  Please get me a copy of your advanced directive form at your convenience.   Do monthly self testicular checks in the shower. You are feeling for lumps/bumps that don't belong. If you feel anything like this, let me know!  Let us know if you need anything.

## 2022-04-29 ENCOUNTER — Ambulatory Visit (INDEPENDENT_AMBULATORY_CARE_PROVIDER_SITE_OTHER): Payer: BC Managed Care – PPO | Admitting: Family Medicine

## 2022-04-29 ENCOUNTER — Encounter: Payer: Self-pay | Admitting: Family Medicine

## 2022-04-29 VITALS — BP 120/80 | HR 101 | Temp 98.6°F | Ht 68.0 in | Wt 176.1 lb

## 2022-04-29 DIAGNOSIS — L600 Ingrowing nail: Secondary | ICD-10-CM | POA: Diagnosis not present

## 2022-04-29 MED ORDER — TRAMADOL HCL 50 MG PO TABS: 50.0000 mg | ORAL_TABLET | Freq: Three times a day (TID) | ORAL | 0 refills | Status: AC | PRN

## 2022-04-29 NOTE — Progress Notes (Signed)
Chief Complaint  Patient presents with   Follow-up    Check toenail    Sean Carroll is a 36 y.o. male here for a skin complaint.  Duration: several weeks Location: L great toe Pruritic? No Painful? Yes Drainage? No Other associated symptoms: scabbing over the area; no fevers Therapies tried thus far: epsom salt soaks  Past Medical History:  Diagnosis Date   ADHD (attention deficit hyperactivity disorder)    Anxiety    Depression    Lung collapse 2015   left spontaneous pneumothorax    Migraine     BP 120/80 (BP Location: Left Arm, Patient Position: Sitting, Cuff Size: Normal)   Pulse (!) 101   Temp 98.6 F (37 C) (Oral)   Ht 5\' 8"  (1.727 m)   Wt 176 lb 2 oz (79.9 kg)   SpO2 98%   BMI 26.78 kg/m  Gen: awake, alert, appearing stated age Lungs: No accessory muscle use Skin: erythema and ttp/excoriation over medial nail fold of the left great toe. TTP w pressure over the nail plate.  Psych: Age appropriate judgment and insight  Procedure note; partial toenail removal Informed consent obtained. The area was anesthetized with a four corner ring block using 1.5 mL of 2% lidocaine without epinephrine in each quadrant, a total of 6 mL. A tourniquet was placed around the base of the toe. Several minutes past while the initial anesthesia started to work. A straight hemostat was used to elevate the nail plate medially. A straight hemostat was then use to wrest the nail from place after using scissors to cut down. Phenol was not used after discussion with the patient. Adequate hemostasis was obtained after the tourniquet was removed. The patient tolerated the procedure well. There were no complications noted.   Ingrown toenail - Plan: traMADol (ULTRAM) 50 MG tablet, Nail removal  Successful partial nail removal today.  Betadine soaks encouraged.  Tylenol, ibuprofen, ice, tramadol for breakthrough pain.  Follow-up in 1 week for wound check. Warning signs and symptoms  verbalized and written down in AVS.  The patient voiced understanding and agreement to the plan.  [place], DO 04/29/22 4:17 PM

## 2022-04-29 NOTE — Patient Instructions (Addendum)
Betadine or iodine soaks twice daily for 10-15 minutes at a time. This is very important!  When you do wash it, use only soap and water. Do not vigorously scrub. Apply triple antibiotic ointment (like Neosporin) twice daily. Keep the area clean and dry.   Things to look out for: increasing pain not relieved by ibuprofen/acetaminophen, fevers, spreading redness, drainage of pus, or foul odor.  OK to take Tylenol 1000 mg (2 extra strength tabs) or 975 mg (3 regular strength tabs) every 6 hours as needed.  Ibuprofen 400-600 mg (2-3 over the counter strength tabs) every 6 hours as needed for pain.  Ice/cold pack over area for 10-15 min twice daily.  Do not drink alcohol, do any illicit/street drugs, drive or do anything that requires alertness while on the tramadol.   Let us know if you need anything.

## 2022-05-06 ENCOUNTER — Ambulatory Visit (INDEPENDENT_AMBULATORY_CARE_PROVIDER_SITE_OTHER): Payer: BC Managed Care – PPO | Admitting: Family Medicine

## 2022-05-06 ENCOUNTER — Encounter: Payer: Self-pay | Admitting: Family Medicine

## 2022-05-06 VITALS — Temp 97.7°F

## 2022-05-06 DIAGNOSIS — L600 Ingrowing nail: Secondary | ICD-10-CM

## 2022-05-06 NOTE — Progress Notes (Signed)
S: CC: F/u  Patient had a partial toenail removal 5 days ago.  He is here for follow-up.  He has been compliant with soaks.  It is scabbed over slightly.  No pain or drainage.  No fevers or spreading redness.  Pain is significantly improved since the procedure.  O: Temp 97.7 F (36.5 C) (Oral)  Appearance: Awake, alert, appears stated age Psych: Age appropriate judgment and insight, normal affect and mood Skin: Small amount of excoriation over the medial nail border of the first digit on the left.  There is no erythema, drainage, TTP, or foul odor  A/P Ingrown toenail Appears to be healing well.  Recommended soaking for another week and then stopping.  He will let me know if anything changes.  He voiced understanding and agreement to the plan.  Jilda Roche Rylee Nuzum 4:10 PM 05/06/22

## 2022-05-06 NOTE — Patient Instructions (Signed)
Soak for another week.   This looks great.   Let us know if you need anything.

## 2022-07-15 DIAGNOSIS — D229 Melanocytic nevi, unspecified: Secondary | ICD-10-CM | POA: Diagnosis not present

## 2022-07-15 DIAGNOSIS — D235 Other benign neoplasm of skin of trunk: Secondary | ICD-10-CM | POA: Diagnosis not present

## 2022-07-15 DIAGNOSIS — D2371 Other benign neoplasm of skin of right lower limb, including hip: Secondary | ICD-10-CM | POA: Diagnosis not present

## 2022-07-15 DIAGNOSIS — D2362 Other benign neoplasm of skin of left upper limb, including shoulder: Secondary | ICD-10-CM | POA: Diagnosis not present

## 2022-08-04 ENCOUNTER — Encounter (HOSPITAL_BASED_OUTPATIENT_CLINIC_OR_DEPARTMENT_OTHER): Payer: Self-pay | Admitting: Emergency Medicine

## 2022-08-04 ENCOUNTER — Emergency Department (HOSPITAL_BASED_OUTPATIENT_CLINIC_OR_DEPARTMENT_OTHER): Payer: BC Managed Care – PPO

## 2022-08-04 ENCOUNTER — Emergency Department (HOSPITAL_BASED_OUTPATIENT_CLINIC_OR_DEPARTMENT_OTHER)
Admission: EM | Admit: 2022-08-04 | Discharge: 2022-08-04 | Disposition: A | Discharge status: Home / Self Care | Payer: BC Managed Care – PPO | Attending: Emergency Medicine | Admitting: Emergency Medicine

## 2022-08-04 ENCOUNTER — Other Ambulatory Visit: Payer: Self-pay

## 2022-08-04 DIAGNOSIS — N132 Hydronephrosis with renal and ureteral calculous obstruction: Secondary | ICD-10-CM | POA: Insufficient documentation

## 2022-08-04 DIAGNOSIS — N200 Calculus of kidney: Secondary | ICD-10-CM

## 2022-08-04 DIAGNOSIS — K760 Fatty (change of) liver, not elsewhere classified: Secondary | ICD-10-CM | POA: Diagnosis not present

## 2022-08-04 DIAGNOSIS — R109 Unspecified abdominal pain: Secondary | ICD-10-CM | POA: Diagnosis not present

## 2022-08-04 DIAGNOSIS — N3289 Other specified disorders of bladder: Secondary | ICD-10-CM | POA: Diagnosis not present

## 2022-08-04 LAB — URINALYSIS, ROUTINE W REFLEX MICROSCOPIC
Bilirubin Urine: NEGATIVE
Glucose, UA: NEGATIVE mg/dL
Ketones, ur: NEGATIVE mg/dL
Leukocytes,Ua: NEGATIVE
Nitrite: NEGATIVE
Protein, ur: NEGATIVE mg/dL
Specific Gravity, Urine: <=1.005 (ref 1.005–1.030)
pH: 5.5 (ref 5.0–8.0)

## 2022-08-04 LAB — CBC WITH DIFFERENTIAL/PLATELET
Abs Immature Granulocytes: 0.04 10*3/uL (ref 0.00–0.07)
Basophils Absolute: 0.1 10*3/uL (ref 0.0–0.1)
Basophils Relative: 0 %
Eosinophils Absolute: 0.1 10*3/uL (ref 0.0–0.5)
Eosinophils Relative: 1 %
HCT: 43.4 % (ref 39.0–52.0)
Hemoglobin: 14.7 g/dL (ref 13.0–17.0)
Immature Granulocytes: 0 %
Lymphocytes Relative: 14 %
Lymphs Abs: 1.7 10*3/uL (ref 0.7–4.0)
MCH: 28.3 pg (ref 26.0–34.0)
MCHC: 33.9 g/dL (ref 30.0–36.0)
MCV: 83.5 fL (ref 80.0–100.0)
Monocytes Absolute: 0.9 10*3/uL (ref 0.1–1.0)
Monocytes Relative: 7 %
Neutro Abs: 9.3 10*3/uL — ABNORMAL HIGH (ref 1.7–7.7)
Neutrophils Relative %: 78 %
Platelets: 361 10*3/uL (ref 150–400)
RBC: 5.2 MIL/uL (ref 4.22–5.81)
RDW: 11.9 % (ref 11.5–15.5)
WBC: 12.1 10*3/uL — ABNORMAL HIGH (ref 4.0–10.5)
nRBC: 0 % (ref 0.0–0.2)

## 2022-08-04 LAB — URINALYSIS, MICROSCOPIC (REFLEX)

## 2022-08-04 LAB — COMPREHENSIVE METABOLIC PANEL
ALT: 45 U/L — ABNORMAL HIGH (ref 0–44)
AST: 32 U/L (ref 15–41)
Albumin: 4.4 g/dL (ref 3.5–5.0)
Alkaline Phosphatase: 33 U/L — ABNORMAL LOW (ref 38–126)
Anion gap: 4 — ABNORMAL LOW (ref 5–15)
BUN: 17 mg/dL (ref 6–20)
CO2: 21 mmol/L — ABNORMAL LOW (ref 22–32)
Calcium: 9 mg/dL (ref 8.9–10.3)
Chloride: 110 mmol/L (ref 98–111)
Creatinine, Ser: 1.24 mg/dL (ref 0.61–1.24)
GFR, Estimated: >60 mL/min (ref >60)
Glucose, Bld: 132 mg/dL — ABNORMAL HIGH (ref 70–99)
Potassium: 4.1 mmol/L (ref 3.5–5.1)
Sodium: 135 mmol/L (ref 135–145)
Total Bilirubin: 1.1 mg/dL (ref 0.3–1.2)
Total Protein: 7.5 g/dL (ref 6.5–8.1)

## 2022-08-04 MED ORDER — TAMSULOSIN HCL 0.4 MG PO CAPS: 0.4000 mg | ORAL_CAPSULE | Freq: Every day | ORAL | 0 refills | Status: AC

## 2022-08-04 MED ORDER — ONDANSETRON HCL 4 MG PO TABS: 4.0000 mg | ORAL_TABLET | Freq: Three times a day (TID) | ORAL | 0 refills | Status: DC | PRN

## 2022-08-04 MED ORDER — SODIUM CHLORIDE 0.9 % IV BOLUS
1000.0000 mL | Freq: Once | INTRAVENOUS | Status: AC
  Administered 2022-08-04: 1000 mL via INTRAVENOUS

## 2022-08-04 MED ORDER — KETOROLAC TROMETHAMINE 15 MG/ML IJ SOLN
15.0000 mg | Freq: Once | INTRAMUSCULAR | Status: AC
  Administered 2022-08-04: 15 mg via INTRAVENOUS
  Filled 2022-08-04: qty 1

## 2022-08-04 MED ORDER — OXYCODONE HCL 5 MG PO TABS: 5.0000 mg | ORAL_TABLET | Freq: Four times a day (QID) | ORAL | 0 refills | Status: DC | PRN

## 2022-08-04 NOTE — ED Triage Notes (Addendum)
Pt c/o left flank pain and pressure feeling in groin x 2 weeks that increased today. Pt has feeling to urinate but only small drops come out today.

## 2022-08-04 NOTE — ED Notes (Signed)
Patient transported to CT 

## 2022-08-04 NOTE — Discharge Instructions (Addendum)
You have a small left-sided kidney stone.  This should pass on its own.  I have written you for oxycodone which is a narcotic pain medicine for breakthrough pain but recommend Tylenol and ibuprofen for pain.  I have prescribed you Flomax also help get rid of the kidney stone.  Please return if you develop fever, uncontrollable pain nausea or vomiting.  Follow-up with urology.

## 2022-08-04 NOTE — ED Provider Notes (Signed)
[facility] Provider Note   CSN: [id number] Arrival date & time: 08/04/22  1046     History  Chief Complaint  Patient presents with   Flank Pain    Sean Carroll is a 37 y.o. male.  Patient here with left flank pain for the last day or so.  He has been having some pain with urination the last few days.  Feels like he has a kidney stone.  No history of kidney stones.  Denies any trauma.  Denies any back pain chest pain or shortness of breath or weakness or numbness or tingling.  Denies any pain in his testicles.  The history is provided by the patient.       Home Medications Prior to Admission medications   Medication Sig Start Date End Date Taking? Authorizing Provider  ondansetron (ZOFRAN) 4 MG tablet Take 1 tablet (4 mg total) by mouth every 8 (eight) hours as needed for nausea or vomiting. 08/04/22  Yes Maley Venezia, DO  oxyCODONE (ROXICODONE) 5 MG immediate release tablet Take 1 tablet (5 mg total) by mouth every 6 (six) hours as needed for up to 10 doses. 08/04/22  Yes Marrah Vanevery, DO  tamsulosin (FLOMAX) 0.4 MG CAPS capsule Take 1 capsule (0.4 mg total) by mouth daily for 7 days. 08/04/22 08/11/22 Yes Jamison Yuhasz, DO  albuterol (VENTOLIN HFA) 108 (90 Base) MCG/ACT inhaler TAKE 2 PUFFS BY MOUTH EVERY 6 HOURS AS NEEDED FOR WHEEZE OR SHORTNESS OF BREATH 05/07/21   Wendling, Crosby Oyster, DO  calcium carbonate (TUMS - DOSED IN MG ELEMENTAL CALCIUM) 500 MG chewable tablet Chew 1-2 tablets by mouth 2 (two) times daily as needed for indigestion or heartburn.    [provider]      Allergies    Adderall [amphetamine-dextroamphetamine]    Review of Systems   Review of Systems  Physical Exam Updated Vital Signs BP (!) 141/97 (BP Location: Right Arm)   Pulse (!) 104   Temp 98.1 F (36.7 C) (Oral)   Resp 18   Ht '5\' 8"'$  (1.727 m)   Wt 77.1 kg   SpO2 100%   BMI 25.85 kg/m  Physical Exam Vitals and nursing  note reviewed.  Constitutional:      General: He is not in acute distress.    Appearance: He is well-developed. He is not ill-appearing.  HENT:     Head: Normocephalic and atraumatic.     Nose: Nose normal.     Mouth/Throat:     Mouth: Mucous membranes are moist.  Eyes:     Extraocular Movements: Extraocular movements intact.     Conjunctiva/sclera: Conjunctivae normal.     Pupils: Pupils are equal, round, and reactive to light.  Cardiovascular:     Rate and Rhythm: Normal rate and regular rhythm.     Pulses: Normal pulses.     Heart sounds: Normal heart sounds. No murmur heard. Pulmonary:     Effort: Pulmonary effort is normal. No respiratory distress.     Breath sounds: Normal breath sounds.  Abdominal:     Palpations: Abdomen is soft.     Tenderness: There is no abdominal tenderness. There is left CVA tenderness.  Musculoskeletal:        General: No swelling.     Cervical back: Neck supple.  Skin:    General: Skin is warm and dry.     Capillary Refill: Capillary refill takes less than 2 seconds.  Neurological:  Mental Status: He is alert.  Psychiatric:        Mood and Affect: Mood normal.     ED Results / Procedures / Treatments   Labs (all labs ordered are listed, but only abnormal results are displayed) Labs Reviewed  CBC WITH DIFFERENTIAL/PLATELET - Abnormal; Notable for the following components:      Result Value   WBC 12.1 (*)    Neutro Abs 9.3 (*)    All other components within normal limits  URINALYSIS, ROUTINE W REFLEX MICROSCOPIC - Abnormal; Notable for the following components:   Hgb urine dipstick TRACE (*)    All other components within normal limits  COMPREHENSIVE METABOLIC PANEL - Abnormal; Notable for the following components:   CO2 21 (*)    Glucose, Bld 132 (*)    ALT 45 (*)    Alkaline Phosphatase 33 (*)    Anion gap 4 (*)    All other components within normal limits  URINALYSIS, MICROSCOPIC (REFLEX) - Abnormal; Notable for the following  components:   Bacteria, UA RARE (*)    All other components within normal limits    EKG None  Radiology CT Renal Stone Study  Result Date: 08/04/2022 CLINICAL DATA:  Abdominal/flank pain, stone suspected EXAM: CT ABDOMEN AND PELVIS WITHOUT CONTRAST TECHNIQUE: Multidetector CT imaging of the abdomen and pelvis was performed following the standard protocol without IV contrast. RADIATION DOSE REDUCTION: This exam was performed according to the departmental dose-optimization program which includes automated exposure control, adjustment of the mA and/or kV according to patient size and/or use of iterative reconstruction technique. COMPARISON:  None Available. FINDINGS: Lower chest: Included lung bases are clear.  Heart size is normal. Hepatobiliary: Mildly decreased attenuation of the hepatic parenchyma. Within the inferior right hepatic lobe is a 2.0 cm indeterminate density lesion (series 2, image 26). No additional liver abnormalities are evident on non contrasted CT. Unremarkable gallbladder. No hyperdense gallstone. No biliary dilatation. Pancreas: Unremarkable. No pancreatic ductal dilatation or surrounding inflammatory changes. Spleen: Normal in size without focal abnormality. Adrenals/Urinary Tract: Unremarkable adrenal glands. 3 mm stone at the left ureterovesical junction resulting in mild left hydroureteronephrosis. No additional calculi within the left kidney or ureter. Right kidney and ureter are normal in appearance. Urinary bladder wall is circumferentially thickened. Stomach/Bowel: Stomach is within normal limits. Appendix appears normal. No evidence of bowel wall thickening, distention, or inflammatory changes. Vascular/Lymphatic: No significant vascular findings are present. No enlarged abdominal or pelvic lymph nodes. Reproductive: Prostate is unremarkable. Other: No free fluid. No abdominopelvic fluid collection. No pneumoperitoneum. Musculoskeletal: No acute or significant osseous  findings. Intraosseous hemangioma incidentally noted in the L4 vertebral body. IMPRESSION: 1. Obstructing 3 mm stone at the left UVJ resulting in mild left hydroureteronephrosis. 2. Circumferentially thickened urinary bladder wall, which may be reactive or represent cystitis. Correlate with urinalysis. 3. Hepatic steatosis. 4. Indeterminate density 2.0 cm lesion within the inferior right hepatic lobe. Further evaluation with nonemergent liver protocol MRI is recommended. Electronically Signed   By: Davina Poke D.O.   On: 08/04/2022 11:30    Procedures Procedures    Medications Ordered in ED Medications  sodium chloride 0.9 % bolus 1,000 mL (1,000 mLs Intravenous New Bag/Given 08/04/22 1125)  ketorolac (TORADOL) 15 MG/ML injection 15 mg (15 mg Intravenous Given 08/04/22 1121)    ED Course/ Medical Decision Making/ A&P  Medical Decision Making Amount and/or Complexity of Data Reviewed Labs: ordered.  Risk Prescription drug management.   Sean Carroll is here with left flank pain.  History of ADHD.  History of depression, migraine and anxiety as well.  Differential diagnosis kidney stone versus MSK pain versus UTI versus less likely bowel obstruction or diverticulitis.  Will get CT scan, CBC, CMP, urinalysis.  Will give IV fluids and IV Toradol and reevaluate.  CT scan of her right report with left-sided kidney stone, 3 mm.  Lab work otherwise per my review and interpretation is unremarkable.  No kidney injury.  No significant leukocytosis.  No UTI.  Will continue pain management at home.  Follow-up with urology.  Return precautions given.  This chart was dictated using voice recognition software.  Despite best efforts to proofread,  errors can occur which can change the documentation meaning.         Final Clinical Impression(s) / ED Diagnoses Final diagnoses:  Kidney stone    Rx / DC Orders ED Discharge Orders          Ordered     oxyCODONE (ROXICODONE) 5 MG immediate release tablet  Every 6 hours PRN        08/04/22 1153    tamsulosin (FLOMAX) 0.4 MG CAPS capsule  Daily        08/04/22 1153    ondansetron (ZOFRAN) 4 MG tablet  Every 8 hours PRN        08/04/22 1153              Geraldene Eisel, DO 08/04/22 1208

## 2022-08-05 ENCOUNTER — Telehealth: Payer: Self-pay

## 2022-08-05 NOTE — Telephone Encounter (Signed)
Patient evaluated/treated in ER.    [facility] Night - Client TELEPHONE ADVICE RECORDAccessNurse Patient Name:Sean Carroll Initial Comment Caller states her husband believes he has a kidney stone. He has a lot of pressure in his pelvic region, severe abd pain, no fever/vomiting. [place] Translation No Nurse Assessment Nurse: Cristino Martes, RN, Joelene Millin Date/Time Eilene Ghazi Time): 08/04/2022 10:14:28 AM Confirm and document reason for call. If symptomatic, describe symptoms. ---Caller states her husband believes he has a kidney stone. S/S started off and two weeks: a lot of pressure in his pelvic region(groin)- constant, severe abd pain:sharp LL back (8-9/10), no fever/vomiting. TX: Drinking a lot water, Aleve, Apple cider vinegar Urinating - unable to. Last void: last night early morning Does the patient have any new or worsening symptoms? ---Yes Will a triage be completed? ---Yes Related visit to physician within the last 2 weeks? ---No Does the PT have any chronic conditions? (i.e. diabetes, asthma, this includes High risk factors for pregnancy, etc.) ---Yes List chronic conditions. ---asthma Is the patient pregnant or possibly pregnant? (Ask all females between the ages of 44-55) ---No Is this a behavioral health or substance abuse call? ---No PLEASE NOTE: All timestamps contained within this report are represented as [place] Standard Time. CONFIDENTIALTY NOTICE: This fax transmission is intended only for the addressee. It contains information that is legally privileged, confidential or otherwise protected from use or disclosure. If you are not the intended recipient, you are strictly prohibited from reviewing, disclosing, copying using or disseminating any of this information or taking any action in reliance on or regarding this information. If you have received this fax in error, please notify us immediately by telephone  so that we can arrange for its return to [place]. Phone: [phone], Toll-Free: [phone], Fax: [phone] Page: 2 of 2 Call Id: [id number] Guidelines Guideline Title Affirmed Question Affirmed Notes Nurse Date/Time Eilene Ghazi Time) Abdominal Pain - Male [1] SEVERE pain (e.g., excruciating) AND [2] present > 1 hour Stripling, RN, Joelene Millin 08/04/2022 10:18:44 AM Disp. Time Eilene Ghazi Time) Disposition Final User 08/04/2022 10:13:20 AM Send to Urgent Queue Kendell Bane 08/04/2022 10:20:02 AM Go to ED Now Yes Cristino Martes, RN, Joelene Millin Final Disposition 08/04/2022 10:20:02 AM Go to ED Now Yes Stripling, RN, Renea Ee Disagree/Comply Comply Caller Understands Yes PreDisposition InappropriateToAsk Care Advice Given Per Guideline GO TO ED NOW: * You need to be seen in the Emergency Department. * Go to the ED at ___________ [facility] now. Drive carefully. ANOTHER ADULT SHOULD DRIVE: * It is better and safer if another adult drives instead of you. NOTHING BY MOUTH: * Do not eat or drink anything for now. CARE ADVICE given per Abdominal Pain - Male (Adult) guideline. Referrals GO TO FACILITY OTHER - SPECIF

## 2022-11-12 ENCOUNTER — Encounter: Payer: Self-pay | Admitting: Family Medicine

## 2022-11-12 ENCOUNTER — Ambulatory Visit (INDEPENDENT_AMBULATORY_CARE_PROVIDER_SITE_OTHER): Payer: BC Managed Care – PPO | Admitting: Family Medicine

## 2022-11-12 VITALS — BP 108/62 | HR 82 | Temp 97.5°F | Ht 68.0 in | Wt 170.1 lb

## 2022-11-12 DIAGNOSIS — M9902 Segmental and somatic dysfunction of thoracic region: Secondary | ICD-10-CM | POA: Diagnosis not present

## 2022-11-12 DIAGNOSIS — M722 Plantar fascial fibromatosis: Secondary | ICD-10-CM

## 2022-11-12 DIAGNOSIS — M9905 Segmental and somatic dysfunction of pelvic region: Secondary | ICD-10-CM | POA: Diagnosis not present

## 2022-11-12 DIAGNOSIS — M9903 Segmental and somatic dysfunction of lumbar region: Secondary | ICD-10-CM | POA: Diagnosis not present

## 2022-11-12 DIAGNOSIS — M9901 Segmental and somatic dysfunction of cervical region: Secondary | ICD-10-CM | POA: Diagnosis not present

## 2022-11-12 NOTE — Progress Notes (Signed)
Musculoskeletal Exam  Patient: Sean Carroll DOB: 1985-09-01  DOS: 11/12/2022  SUBJECTIVE:  Chief Complaint:   Chief Complaint  Patient presents with   Foot Pain    Sean Carroll is a 37 y.o.  male for evaluation and treatment of foot pain.   Onset:  severalmonths ago. No inj or change in activity.  Location: near heel Character:  sharp  Progression of issue:  is unchanged Associated symptoms: worse in AM No bruising, swelling, redness. Treatment: to date has been ice, inserts.   Neurovascular symptoms: no  Past Medical History:  Diagnosis Date   ADHD (attention deficit hyperactivity disorder)    Anxiety    Depression    Lung collapse 2015   left spontaneous pneumothorax    Migraine     Objective: VITAL SIGNS: BP 108/62 (BP Location: Left Arm, Patient Position: Sitting, Cuff Size: Normal)   Pulse 82   Temp (!) 97.5 F (36.4 C) (Oral)   Ht 5\' 8"  (1.727 m)   Wt 170 lb 2 oz (77.2 kg)   SpO2 96%   BMI 25.87 kg/m  Constitutional: Well formed, well developed. No acute distress. Thorax & Lungs: No accessory muscle use Musculoskeletal: Feet.   Normal active range of motion: yes.   Normal passive range of motion: yes Tenderness to palpation: yes, over medial prox insertion of plantar fascia b/l Deformity: no Ecchymosis: no Neurologic: Normal sensory function.  Psychiatric: Normal mood. Age appropriate judgment and insight. Alert & oriented x 3.    Assessment:  Plantar fasciitis - Plan: Ambulatory referral to Podiatry  Plan: Stretches/exercises, heat, ice, Tylenol. [place] sock rec'd. Refer podiatry at his request.  F/u as originally scheduled. The patient voiced understanding and agreement to the plan.   Jilda Roche [place], DO 11/12/22  10:40 AM

## 2022-11-12 NOTE — Patient Instructions (Addendum)
Ice/cold pack over area for 10-15 min twice daily.  OK to take Tylenol 1000 mg (2 extra strength tabs) or 975 mg (3 regular strength tabs) every 6 hours as needed.  Continue with your inserts.  Consider a posterior splint or [place] sock to wear at bedtime.  If you do not hear anything about your referral in the next 1-2 weeks, call our office and ask for an update.  Get some Pepcid/famotidine to take (20 mg) twice daily as needed.   Plantar Fasciitis Stretches/exercises Do exercises exactly as told by your health care provider and adjust them as directed. It is normal to feel mild stretching, pulling, tightness, or discomfort as you do these exercises, but you should stop right away if you feel sudden pain or your pain gets worse.   Stretching and range of motion exercises These exercises warm up your muscles and joints and improve the movement and flexibility of your foot. These exercises also help to relieve pain.  Exercise A: Plantar fascia stretch Sit with your left / right leg crossed over your opposite knee. Hold your heel with one hand with that thumb near your arch. With your other hand, hold your toes and gently pull them back toward the top of your foot. You should feel a stretch on the bottom of your toes or your foot or both. Hold this stretch for 30 seconds. Slowly release your toes and return to the starting position. Repeat 2 times. Complete this exercise 3 times per week.  Exercise B: Gastroc, standing Stand with your hands against a wall. Extend your left / right leg behind you, and bend your front knee slightly. Keeping your heels on the floor and keeping your back knee straight, shift your weight toward the wall without arching your back. You should feel a gentle stretch in your left / right calf. Hold this position for 30 seconds. Repeat 2 times. Complete this exercise 3 times a week. Exercise C: Soleus, standing Stand with your hands against a wall. Extend  your left / right leg behind you, and bend your front knee slightly. Keeping your heels on the floor, bend your back knee and slightly shift your weight over the back leg. You should feel a gentle stretch deep in your calf. Hold this position for 30 seconds. Repeat 2 times. Complete this exercise 3 times per week. Exercise D: Gastrocsoleus, standing Stand with the ball of your left / right foot on a step. The ball of your foot is on the walking surface, right under your toes. Keep your other foot firmly on the same step. Hold onto the wall or a railing for balance. Slowly lift your other foot, allowing your body weight to press your heel down over the edge of the step. You should feel a stretch in your left / right calf. Hold this position for 30 seconds. Return both feet to the step. Repeat this exercise with a slight bend in your left / right knee. Repeat 2 times with your left / right knee straight and 2times with your left / right knee bent. Complete this exercise 3 times a week.  Balance exercise This exercise builds your balance and strength control of your arch to help take pressure off your plantar fascia. Exercise E: Single leg stand Without shoes, stand near a railing or in a doorway. You may hold onto the railing or door frame as needed. Stand on your left / right foot. Keep your big toe down on the floor and  try to keep your arch lifted. Do not let your foot roll inward. Hold this position for 30 seconds. If this exercise is too easy, you can try it with your eyes closed or while standing on a pillow. Repeat 2 times. Complete this exercise 3 times per week. This information is not intended to replace advice given to you by your health care provider. Make sure you discuss any questions you have with your health care provider. Document Released: 05/27/2005 Document Revised: 01/30/2016 Document Reviewed: 04/10/2015 Elsevier Interactive Patient Education  2017 [employer].  Yes  ove

## 2022-11-13 DIAGNOSIS — M9902 Segmental and somatic dysfunction of thoracic region: Secondary | ICD-10-CM | POA: Diagnosis not present

## 2022-11-13 DIAGNOSIS — M9901 Segmental and somatic dysfunction of cervical region: Secondary | ICD-10-CM | POA: Diagnosis not present

## 2022-11-13 DIAGNOSIS — M9903 Segmental and somatic dysfunction of lumbar region: Secondary | ICD-10-CM | POA: Diagnosis not present

## 2022-11-13 DIAGNOSIS — M9905 Segmental and somatic dysfunction of pelvic region: Secondary | ICD-10-CM | POA: Diagnosis not present

## 2022-11-19 DIAGNOSIS — M9901 Segmental and somatic dysfunction of cervical region: Secondary | ICD-10-CM | POA: Diagnosis not present

## 2022-11-19 DIAGNOSIS — M9902 Segmental and somatic dysfunction of thoracic region: Secondary | ICD-10-CM | POA: Diagnosis not present

## 2022-11-19 DIAGNOSIS — M9903 Segmental and somatic dysfunction of lumbar region: Secondary | ICD-10-CM | POA: Diagnosis not present

## 2022-11-19 DIAGNOSIS — M9905 Segmental and somatic dysfunction of pelvic region: Secondary | ICD-10-CM | POA: Diagnosis not present

## 2022-11-21 DIAGNOSIS — M9901 Segmental and somatic dysfunction of cervical region: Secondary | ICD-10-CM | POA: Diagnosis not present

## 2022-11-21 DIAGNOSIS — M9903 Segmental and somatic dysfunction of lumbar region: Secondary | ICD-10-CM | POA: Diagnosis not present

## 2022-11-21 DIAGNOSIS — M9905 Segmental and somatic dysfunction of pelvic region: Secondary | ICD-10-CM | POA: Diagnosis not present

## 2022-11-21 DIAGNOSIS — M9902 Segmental and somatic dysfunction of thoracic region: Secondary | ICD-10-CM | POA: Diagnosis not present

## 2022-11-22 ENCOUNTER — Ambulatory Visit: Payer: BC Managed Care – PPO | Admitting: Podiatry

## 2022-11-26 DIAGNOSIS — M9905 Segmental and somatic dysfunction of pelvic region: Secondary | ICD-10-CM | POA: Diagnosis not present

## 2022-11-26 DIAGNOSIS — M9901 Segmental and somatic dysfunction of cervical region: Secondary | ICD-10-CM | POA: Diagnosis not present

## 2022-11-26 DIAGNOSIS — M9903 Segmental and somatic dysfunction of lumbar region: Secondary | ICD-10-CM | POA: Diagnosis not present

## 2022-11-26 DIAGNOSIS — M9902 Segmental and somatic dysfunction of thoracic region: Secondary | ICD-10-CM | POA: Diagnosis not present

## 2022-11-28 DIAGNOSIS — M9901 Segmental and somatic dysfunction of cervical region: Secondary | ICD-10-CM | POA: Diagnosis not present

## 2022-11-28 DIAGNOSIS — M9905 Segmental and somatic dysfunction of pelvic region: Secondary | ICD-10-CM | POA: Diagnosis not present

## 2022-11-28 DIAGNOSIS — M9903 Segmental and somatic dysfunction of lumbar region: Secondary | ICD-10-CM | POA: Diagnosis not present

## 2022-11-28 DIAGNOSIS — M9902 Segmental and somatic dysfunction of thoracic region: Secondary | ICD-10-CM | POA: Diagnosis not present

## 2022-12-03 DIAGNOSIS — M9901 Segmental and somatic dysfunction of cervical region: Secondary | ICD-10-CM | POA: Diagnosis not present

## 2022-12-03 DIAGNOSIS — M9903 Segmental and somatic dysfunction of lumbar region: Secondary | ICD-10-CM | POA: Diagnosis not present

## 2022-12-03 DIAGNOSIS — M9902 Segmental and somatic dysfunction of thoracic region: Secondary | ICD-10-CM | POA: Diagnosis not present

## 2022-12-03 DIAGNOSIS — M9905 Segmental and somatic dysfunction of pelvic region: Secondary | ICD-10-CM | POA: Diagnosis not present

## 2022-12-05 ENCOUNTER — Ambulatory Visit (INDEPENDENT_AMBULATORY_CARE_PROVIDER_SITE_OTHER): Payer: BC Managed Care – PPO | Admitting: Podiatry

## 2022-12-05 ENCOUNTER — Encounter: Payer: Self-pay | Admitting: Podiatry

## 2022-12-05 ENCOUNTER — Ambulatory Visit (INDEPENDENT_AMBULATORY_CARE_PROVIDER_SITE_OTHER): Payer: BC Managed Care – PPO

## 2022-12-05 DIAGNOSIS — M9901 Segmental and somatic dysfunction of cervical region: Secondary | ICD-10-CM | POA: Diagnosis not present

## 2022-12-05 DIAGNOSIS — M722 Plantar fascial fibromatosis: Secondary | ICD-10-CM

## 2022-12-05 DIAGNOSIS — M9902 Segmental and somatic dysfunction of thoracic region: Secondary | ICD-10-CM | POA: Diagnosis not present

## 2022-12-05 DIAGNOSIS — M19072 Primary osteoarthritis, left ankle and foot: Secondary | ICD-10-CM | POA: Diagnosis not present

## 2022-12-05 DIAGNOSIS — M79672 Pain in left foot: Secondary | ICD-10-CM | POA: Diagnosis not present

## 2022-12-05 DIAGNOSIS — M9905 Segmental and somatic dysfunction of pelvic region: Secondary | ICD-10-CM | POA: Diagnosis not present

## 2022-12-05 DIAGNOSIS — M9903 Segmental and somatic dysfunction of lumbar region: Secondary | ICD-10-CM | POA: Diagnosis not present

## 2022-12-05 DIAGNOSIS — M7731 Calcaneal spur, right foot: Secondary | ICD-10-CM | POA: Diagnosis not present

## 2022-12-05 MED ORDER — MELOXICAM 15 MG PO TABS: 15.0000 mg | ORAL_TABLET | Freq: Every day | ORAL | 0 refills | Status: DC

## 2022-12-05 NOTE — Patient Instructions (Signed)

## 2022-12-05 NOTE — Progress Notes (Signed)
  Subjective:  Patient ID: Sean Carroll, male    DOB: 10-28-1985,   MRN: [id number]  Chief Complaint  Patient presents with   Foot Pain    Bilateral foot pain on going for years left foot is worse than right foot      37 y.o. male presents for concern of bilateral foot pain left more so than right that has been a problem for years. Relates he has tried good feet inserts and ibuprofen. Relates a lot of pain with first steps in the morning. 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  . Denies any other pedal complaints. Denies n/v/f/c.   Past Medical History:  Diagnosis Date   ADHD (attention deficit hyperactivity disorder)    Anxiety    Depression    Lung collapse 2015   left spontaneous pneumothorax    Migraine     Objective:  Physical Exam: Vascular: DP/PT pulses 2/4 bilateral. CFT <3 seconds. Normal hair growth on digits. No edema.  Skin. No lacerations or abrasions bilateral feet.  Musculoskeletal: MMT 5/5 bilateral lower extremities in DF, PF, Inversion and Eversion. Deceased ROM in DF of ankle joint. Tender to medial calcaneal tubercle more so on the left than right. No pain with calcaneal squeeze. No pain to achilles or PT tendon.  Neurological: Sensation intact to light touch.   Assessment:   1. Plantar fasciitis, left      Plan:  Patient was evaluated and treated and all questions answered. Discussed plantar fasciitis with patient.  X-rays reviewed and discussed with patient. No acute fractures or dislocations noted. Minimal spurring noted at inferior calcaneus.  Discussed treatment options including, ice, NSAIDS, supportive shoes, bracing, and stretching. Stretching exercises provided to be done on a daily basis.   Prescription for meloxicam provided and sent to pharmacy. Night splint dispensed.  Follow-up 6 weeks or sooner if any problems arise. In the meantime, encouraged to call the office with any questions, concerns, change in symptoms.       Louann Sjogren, DPM

## 2022-12-10 DIAGNOSIS — M9902 Segmental and somatic dysfunction of thoracic region: Secondary | ICD-10-CM | POA: Diagnosis not present

## 2022-12-10 DIAGNOSIS — M9905 Segmental and somatic dysfunction of pelvic region: Secondary | ICD-10-CM | POA: Diagnosis not present

## 2022-12-10 DIAGNOSIS — M9901 Segmental and somatic dysfunction of cervical region: Secondary | ICD-10-CM | POA: Diagnosis not present

## 2022-12-10 DIAGNOSIS — M9903 Segmental and somatic dysfunction of lumbar region: Secondary | ICD-10-CM | POA: Diagnosis not present

## 2022-12-11 DIAGNOSIS — M9901 Segmental and somatic dysfunction of cervical region: Secondary | ICD-10-CM | POA: Diagnosis not present

## 2022-12-11 DIAGNOSIS — M9903 Segmental and somatic dysfunction of lumbar region: Secondary | ICD-10-CM | POA: Diagnosis not present

## 2022-12-11 DIAGNOSIS — M9902 Segmental and somatic dysfunction of thoracic region: Secondary | ICD-10-CM | POA: Diagnosis not present

## 2022-12-11 DIAGNOSIS — M9905 Segmental and somatic dysfunction of pelvic region: Secondary | ICD-10-CM | POA: Diagnosis not present

## 2022-12-17 DIAGNOSIS — M9901 Segmental and somatic dysfunction of cervical region: Secondary | ICD-10-CM | POA: Diagnosis not present

## 2022-12-17 DIAGNOSIS — M9903 Segmental and somatic dysfunction of lumbar region: Secondary | ICD-10-CM | POA: Diagnosis not present

## 2022-12-17 DIAGNOSIS — M9905 Segmental and somatic dysfunction of pelvic region: Secondary | ICD-10-CM | POA: Diagnosis not present

## 2022-12-17 DIAGNOSIS — M9902 Segmental and somatic dysfunction of thoracic region: Secondary | ICD-10-CM | POA: Diagnosis not present

## 2022-12-19 DIAGNOSIS — M9902 Segmental and somatic dysfunction of thoracic region: Secondary | ICD-10-CM | POA: Diagnosis not present

## 2022-12-19 DIAGNOSIS — M9901 Segmental and somatic dysfunction of cervical region: Secondary | ICD-10-CM | POA: Diagnosis not present

## 2022-12-19 DIAGNOSIS — M9905 Segmental and somatic dysfunction of pelvic region: Secondary | ICD-10-CM | POA: Diagnosis not present

## 2022-12-19 DIAGNOSIS — M9903 Segmental and somatic dysfunction of lumbar region: Secondary | ICD-10-CM | POA: Diagnosis not present

## 2022-12-24 DIAGNOSIS — M9902 Segmental and somatic dysfunction of thoracic region: Secondary | ICD-10-CM | POA: Diagnosis not present

## 2022-12-24 DIAGNOSIS — M9901 Segmental and somatic dysfunction of cervical region: Secondary | ICD-10-CM | POA: Diagnosis not present

## 2022-12-24 DIAGNOSIS — M9903 Segmental and somatic dysfunction of lumbar region: Secondary | ICD-10-CM | POA: Diagnosis not present

## 2022-12-24 DIAGNOSIS — M9905 Segmental and somatic dysfunction of pelvic region: Secondary | ICD-10-CM | POA: Diagnosis not present

## 2022-12-25 ENCOUNTER — Encounter: Payer: Self-pay | Admitting: Family Medicine

## 2022-12-25 ENCOUNTER — Telehealth: Payer: BC Managed Care – PPO | Admitting: Family Medicine

## 2022-12-25 DIAGNOSIS — U071 COVID-19: Secondary | ICD-10-CM

## 2022-12-25 MED ORDER — PROMETHAZINE-DM 6.25-15 MG/5ML PO SYRP: 5.0000 mL | ORAL_SOLUTION | Freq: Four times a day (QID) | ORAL | 0 refills | Status: DC | PRN

## 2022-12-25 NOTE — Progress Notes (Signed)
Chief Complaint  Patient presents with   Covid Positive    Tested postive 12/24/22 Congestion Cough Headache Body aches No energy     Sean Carroll here for URI complaints. We are interacting via web portal for an electronic face-to-face visit. I verified patient's ID using 2 identifiers. Patient agreed to proceed with visit via this method. Patient is at home, I am at office. Patient and I are present for visit.   Duration: 2 days  Associated symptoms: sinus headache, sinus congestion, rhinorrhea, myalgia, and coughing, fatigue, dulled sense of taste/smell Denies: sinus pain, itchy watery eyes, ear pain, ear drainage, sore throat, wheezing, shortness of breath, and N/V/D Treatment to date: Hall's lozenges, Nyquil, Dayquil Sick contacts: Yes- coworker Tested + for covid on 12/24/22  Past Medical History:  Diagnosis Date   ADHD (attention deficit hyperactivity disorder)    Anxiety    Depression    Lung collapse 2015   left spontaneous pneumothorax    Migraine     Objective No conversational dyspnea Age appropriate judgment and insight Nml affect and mood  COVID-19 - Plan: promethazine-dextromethorphan (PROMETHAZINE-DM) 6.25-15 MG/5ML syrup  Tested + for covid. Warned about drowsiness w syrup. Continue to push fluids, practice good hand hygiene, cover mouth when coughing. Discussed CDC quarantining guidelines.  F/u prn. If starting to experience irreplaceable fluid loss, shaking, or shortness of breath, seek immediate care. Pt voiced understanding and agreement to the plan.  [name], DO 12/25/22 10:36 AM

## 2023-01-02 DIAGNOSIS — M9901 Segmental and somatic dysfunction of cervical region: Secondary | ICD-10-CM | POA: Diagnosis not present

## 2023-01-02 DIAGNOSIS — M9902 Segmental and somatic dysfunction of thoracic region: Secondary | ICD-10-CM | POA: Diagnosis not present

## 2023-01-02 DIAGNOSIS — M9903 Segmental and somatic dysfunction of lumbar region: Secondary | ICD-10-CM | POA: Diagnosis not present

## 2023-01-02 DIAGNOSIS — M9905 Segmental and somatic dysfunction of pelvic region: Secondary | ICD-10-CM | POA: Diagnosis not present

## 2023-01-07 DIAGNOSIS — M9901 Segmental and somatic dysfunction of cervical region: Secondary | ICD-10-CM | POA: Diagnosis not present

## 2023-01-07 DIAGNOSIS — M9903 Segmental and somatic dysfunction of lumbar region: Secondary | ICD-10-CM | POA: Diagnosis not present

## 2023-01-07 DIAGNOSIS — M9902 Segmental and somatic dysfunction of thoracic region: Secondary | ICD-10-CM | POA: Diagnosis not present

## 2023-01-07 DIAGNOSIS — M9905 Segmental and somatic dysfunction of pelvic region: Secondary | ICD-10-CM | POA: Diagnosis not present

## 2023-01-09 DIAGNOSIS — M9903 Segmental and somatic dysfunction of lumbar region: Secondary | ICD-10-CM | POA: Diagnosis not present

## 2023-01-09 DIAGNOSIS — M9902 Segmental and somatic dysfunction of thoracic region: Secondary | ICD-10-CM | POA: Diagnosis not present

## 2023-01-09 DIAGNOSIS — M9905 Segmental and somatic dysfunction of pelvic region: Secondary | ICD-10-CM | POA: Diagnosis not present

## 2023-01-09 DIAGNOSIS — M9901 Segmental and somatic dysfunction of cervical region: Secondary | ICD-10-CM | POA: Diagnosis not present

## 2023-01-14 DIAGNOSIS — M9901 Segmental and somatic dysfunction of cervical region: Secondary | ICD-10-CM | POA: Diagnosis not present

## 2023-01-14 DIAGNOSIS — M9902 Segmental and somatic dysfunction of thoracic region: Secondary | ICD-10-CM | POA: Diagnosis not present

## 2023-01-14 DIAGNOSIS — M9903 Segmental and somatic dysfunction of lumbar region: Secondary | ICD-10-CM | POA: Diagnosis not present

## 2023-01-14 DIAGNOSIS — M9905 Segmental and somatic dysfunction of pelvic region: Secondary | ICD-10-CM | POA: Diagnosis not present

## 2023-01-16 ENCOUNTER — Ambulatory Visit: Payer: BC Managed Care – PPO | Admitting: Podiatry

## 2023-01-16 DIAGNOSIS — M9902 Segmental and somatic dysfunction of thoracic region: Secondary | ICD-10-CM | POA: Diagnosis not present

## 2023-01-16 DIAGNOSIS — M9901 Segmental and somatic dysfunction of cervical region: Secondary | ICD-10-CM | POA: Diagnosis not present

## 2023-01-16 DIAGNOSIS — M9903 Segmental and somatic dysfunction of lumbar region: Secondary | ICD-10-CM | POA: Diagnosis not present

## 2023-01-16 DIAGNOSIS — M9905 Segmental and somatic dysfunction of pelvic region: Secondary | ICD-10-CM | POA: Diagnosis not present

## 2023-01-21 DIAGNOSIS — M9905 Segmental and somatic dysfunction of pelvic region: Secondary | ICD-10-CM | POA: Diagnosis not present

## 2023-01-21 DIAGNOSIS — M9903 Segmental and somatic dysfunction of lumbar region: Secondary | ICD-10-CM | POA: Diagnosis not present

## 2023-01-21 DIAGNOSIS — M9902 Segmental and somatic dysfunction of thoracic region: Secondary | ICD-10-CM | POA: Diagnosis not present

## 2023-01-21 DIAGNOSIS — M9901 Segmental and somatic dysfunction of cervical region: Secondary | ICD-10-CM | POA: Diagnosis not present

## 2023-01-23 ENCOUNTER — Ambulatory Visit (INDEPENDENT_AMBULATORY_CARE_PROVIDER_SITE_OTHER): Payer: BC Managed Care – PPO | Admitting: Podiatry

## 2023-01-23 ENCOUNTER — Encounter: Payer: Self-pay | Admitting: Podiatry

## 2023-01-23 DIAGNOSIS — M9901 Segmental and somatic dysfunction of cervical region: Secondary | ICD-10-CM | POA: Diagnosis not present

## 2023-01-23 DIAGNOSIS — M9903 Segmental and somatic dysfunction of lumbar region: Secondary | ICD-10-CM | POA: Diagnosis not present

## 2023-01-23 DIAGNOSIS — M9902 Segmental and somatic dysfunction of thoracic region: Secondary | ICD-10-CM | POA: Diagnosis not present

## 2023-01-23 DIAGNOSIS — M9905 Segmental and somatic dysfunction of pelvic region: Secondary | ICD-10-CM | POA: Diagnosis not present

## 2023-01-23 DIAGNOSIS — M722 Plantar fascial fibromatosis: Secondary | ICD-10-CM | POA: Diagnosis not present

## 2023-01-23 NOTE — Progress Notes (Signed)
  Subjective:  Patient ID: Sean Carroll, male    DOB: [birth year],   MRN: [id number]  Chief Complaint  Patient presents with   Foot Pain    Plantar fasciitis follow up, pt stated that he is doing so much better     37 y.o. male presents for follow-up of left heel pain. Relates it is about 90% better. He has been stretching and wearing the splint occasionally at night but much improved. Also wearing good feet inserts to work which helps.  . Denies any other pedal complaints. Denies n/v/f/c.   Past Medical History:  Diagnosis Date   ADHD (attention deficit hyperactivity disorder)    Anxiety    Depression    Lung collapse 2015   left spontaneous pneumothorax    Migraine     Objective:  Physical Exam: Vascular: DP/PT pulses 2/4 bilateral. CFT <3 seconds. Normal hair growth on digits. No edema.  Skin. No lacerations or abrasions bilateral feet.  Musculoskeletal: MMT 5/5 bilateral lower extremities in DF, PF, Inversion and Eversion. Deceased ROM in DF of ankle joint. Minimally ender to medial calcaneal tubercle more so on the left than right. No pain with calcaneal squeeze. No pain to achilles or PT tendon.  Neurological: Sensation intact to light touch.   Assessment:   1. Plantar fasciitis, left       Plan:  Patient was evaluated and treated and all questions answered. Discussed plantar fasciitis with patient.  X-rays reviewed and discussed with patient. No acute fractures or dislocations noted. Minimal spurring noted at inferior calcaneus.  Discussed treatment options including, ice, NSAIDS, supportive shoes, bracing, and stretching.  Continue stretching and night splint.  NAIDS as needed.   Follow-up as needed in futur e     Louann Sjogren, DPM

## 2023-01-28 DIAGNOSIS — M9903 Segmental and somatic dysfunction of lumbar region: Secondary | ICD-10-CM | POA: Diagnosis not present

## 2023-01-28 DIAGNOSIS — M9905 Segmental and somatic dysfunction of pelvic region: Secondary | ICD-10-CM | POA: Diagnosis not present

## 2023-01-28 DIAGNOSIS — M9902 Segmental and somatic dysfunction of thoracic region: Secondary | ICD-10-CM | POA: Diagnosis not present

## 2023-01-28 DIAGNOSIS — M9901 Segmental and somatic dysfunction of cervical region: Secondary | ICD-10-CM | POA: Diagnosis not present

## 2023-01-30 DIAGNOSIS — M9902 Segmental and somatic dysfunction of thoracic region: Secondary | ICD-10-CM | POA: Diagnosis not present

## 2023-01-30 DIAGNOSIS — M9903 Segmental and somatic dysfunction of lumbar region: Secondary | ICD-10-CM | POA: Diagnosis not present

## 2023-01-30 DIAGNOSIS — M9901 Segmental and somatic dysfunction of cervical region: Secondary | ICD-10-CM | POA: Diagnosis not present

## 2023-01-30 DIAGNOSIS — M9905 Segmental and somatic dysfunction of pelvic region: Secondary | ICD-10-CM | POA: Diagnosis not present

## 2023-02-03 DIAGNOSIS — M9905 Segmental and somatic dysfunction of pelvic region: Secondary | ICD-10-CM | POA: Diagnosis not present

## 2023-02-03 DIAGNOSIS — M9902 Segmental and somatic dysfunction of thoracic region: Secondary | ICD-10-CM | POA: Diagnosis not present

## 2023-02-03 DIAGNOSIS — M9901 Segmental and somatic dysfunction of cervical region: Secondary | ICD-10-CM | POA: Diagnosis not present

## 2023-02-03 DIAGNOSIS — M9903 Segmental and somatic dysfunction of lumbar region: Secondary | ICD-10-CM | POA: Diagnosis not present

## 2023-02-05 DIAGNOSIS — M9901 Segmental and somatic dysfunction of cervical region: Secondary | ICD-10-CM | POA: Diagnosis not present

## 2023-02-05 DIAGNOSIS — M9902 Segmental and somatic dysfunction of thoracic region: Secondary | ICD-10-CM | POA: Diagnosis not present

## 2023-02-05 DIAGNOSIS — M9903 Segmental and somatic dysfunction of lumbar region: Secondary | ICD-10-CM | POA: Diagnosis not present

## 2023-02-05 DIAGNOSIS — M9905 Segmental and somatic dysfunction of pelvic region: Secondary | ICD-10-CM | POA: Diagnosis not present

## 2023-02-11 DIAGNOSIS — M9903 Segmental and somatic dysfunction of lumbar region: Secondary | ICD-10-CM | POA: Diagnosis not present

## 2023-02-11 DIAGNOSIS — M9905 Segmental and somatic dysfunction of pelvic region: Secondary | ICD-10-CM | POA: Diagnosis not present

## 2023-02-11 DIAGNOSIS — M9901 Segmental and somatic dysfunction of cervical region: Secondary | ICD-10-CM | POA: Diagnosis not present

## 2023-02-11 DIAGNOSIS — M9902 Segmental and somatic dysfunction of thoracic region: Secondary | ICD-10-CM | POA: Diagnosis not present

## 2023-02-13 DIAGNOSIS — M9905 Segmental and somatic dysfunction of pelvic region: Secondary | ICD-10-CM | POA: Diagnosis not present

## 2023-02-13 DIAGNOSIS — M9901 Segmental and somatic dysfunction of cervical region: Secondary | ICD-10-CM | POA: Diagnosis not present

## 2023-02-13 DIAGNOSIS — M9903 Segmental and somatic dysfunction of lumbar region: Secondary | ICD-10-CM | POA: Diagnosis not present

## 2023-02-13 DIAGNOSIS — M9902 Segmental and somatic dysfunction of thoracic region: Secondary | ICD-10-CM | POA: Diagnosis not present

## 2023-02-18 DIAGNOSIS — M9902 Segmental and somatic dysfunction of thoracic region: Secondary | ICD-10-CM | POA: Diagnosis not present

## 2023-02-18 DIAGNOSIS — M9905 Segmental and somatic dysfunction of pelvic region: Secondary | ICD-10-CM | POA: Diagnosis not present

## 2023-02-18 DIAGNOSIS — M9903 Segmental and somatic dysfunction of lumbar region: Secondary | ICD-10-CM | POA: Diagnosis not present

## 2023-02-18 DIAGNOSIS — M9901 Segmental and somatic dysfunction of cervical region: Secondary | ICD-10-CM | POA: Diagnosis not present

## 2023-02-20 DIAGNOSIS — M9903 Segmental and somatic dysfunction of lumbar region: Secondary | ICD-10-CM | POA: Diagnosis not present

## 2023-02-20 DIAGNOSIS — M9905 Segmental and somatic dysfunction of pelvic region: Secondary | ICD-10-CM | POA: Diagnosis not present

## 2023-02-20 DIAGNOSIS — M9901 Segmental and somatic dysfunction of cervical region: Secondary | ICD-10-CM | POA: Diagnosis not present

## 2023-02-20 DIAGNOSIS — M9902 Segmental and somatic dysfunction of thoracic region: Secondary | ICD-10-CM | POA: Diagnosis not present

## 2023-02-27 DIAGNOSIS — M9902 Segmental and somatic dysfunction of thoracic region: Secondary | ICD-10-CM | POA: Diagnosis not present

## 2023-02-27 DIAGNOSIS — M9903 Segmental and somatic dysfunction of lumbar region: Secondary | ICD-10-CM | POA: Diagnosis not present

## 2023-02-27 DIAGNOSIS — M9905 Segmental and somatic dysfunction of pelvic region: Secondary | ICD-10-CM | POA: Diagnosis not present

## 2023-02-27 DIAGNOSIS — M9901 Segmental and somatic dysfunction of cervical region: Secondary | ICD-10-CM | POA: Diagnosis not present

## 2023-04-14 ENCOUNTER — Encounter: Payer: Self-pay | Admitting: Family Medicine

## 2023-04-14 ENCOUNTER — Other Ambulatory Visit: Payer: Self-pay

## 2023-04-14 ENCOUNTER — Ambulatory Visit (INDEPENDENT_AMBULATORY_CARE_PROVIDER_SITE_OTHER): Payer: BC Managed Care – PPO | Admitting: Family Medicine

## 2023-04-14 VITALS — BP 112/68 | HR 84 | Temp 98.0°F | Resp 16 | Ht 68.0 in | Wt 169.0 lb

## 2023-04-14 DIAGNOSIS — Z0000 Encounter for general adult medical examination without abnormal findings: Secondary | ICD-10-CM | POA: Diagnosis not present

## 2023-04-14 DIAGNOSIS — R7309 Other abnormal glucose: Secondary | ICD-10-CM

## 2023-04-14 LAB — COMPREHENSIVE METABOLIC PANEL
ALT: 38 U/L (ref 0–53)
AST: 22 U/L (ref 0–37)
Albumin: 4.6 g/dL (ref 3.5–5.2)
Alkaline Phosphatase: 50 U/L (ref 39–117)
BUN: 16 mg/dL (ref 6–23)
CO2: 23 meq/L (ref 19–32)
Calcium: 9.7 mg/dL (ref 8.4–10.5)
Chloride: 105 meq/L (ref 96–112)
Creatinine, Ser: 1.13 mg/dL (ref 0.40–1.50)
GFR: 81.8 mL/min (ref >60.00)
Glucose, Bld: 113 mg/dL — ABNORMAL HIGH (ref 70–99)
Potassium: 4.4 meq/L (ref 3.5–5.1)
Sodium: 139 meq/L (ref 135–145)
Total Bilirubin: 1.2 mg/dL (ref 0.2–1.2)
Total Protein: 7.2 g/dL (ref 6.0–8.3)

## 2023-04-14 LAB — LIPID PANEL
Cholesterol: 221 mg/dL — ABNORMAL HIGH (ref 0–200)
HDL: 42.3 mg/dL (ref >39.00)
LDL Cholesterol: 154 mg/dL — ABNORMAL HIGH (ref 0–99)
NonHDL: 178.57
Total CHOL/HDL Ratio: 5
Triglycerides: 122 mg/dL (ref 0.0–149.0)
VLDL: 24.4 mg/dL (ref 0.0–40.0)

## 2023-04-14 LAB — CBC
HCT: 46.4 % (ref 39.0–52.0)
Hemoglobin: 15.5 g/dL (ref 13.0–17.0)
MCHC: 33.3 g/dL (ref 30.0–36.0)
MCV: 86 fL (ref 78.0–100.0)
Platelets: 376 10*3/uL (ref 150.0–400.0)
RBC: 5.4 Mil/uL (ref 4.22–5.81)
RDW: 12.8 % (ref 11.5–15.5)
WBC: 6.4 10*3/uL (ref 4.0–10.5)

## 2023-04-14 NOTE — Patient Instructions (Signed)
Give [place] 2-3 business days to get the results of your labs back.   Keep the diet clean and stay active.  Please get me a copy of your advanced directive form at your convenience.   Do monthly self testicular checks in the shower until you turn 38. You are feeling for lumps/bumps that don't belong. If you feel anything like this, let me know!  Let us know if you need anything.

## 2023-04-14 NOTE — Progress Notes (Addendum)
Chief Complaint  Patient presents with   Annual Exam    Annual Exam    Well Male Sean Carroll is here for a complete physical.   His last physical was >1 year ago.  Current diet: in general, a "healthy" diet.   Current exercise: Crossfit, cardio, wt lifting Weight trend: stable Fatigue out of ordinary? No. Seat belt? Yes.   Advanced directive? Yes  Health maintenance Tetanus- Yes HIV- Yes Hep C- Yes  Past Medical History:  Diagnosis Date   ADHD (attention deficit hyperactivity disorder)    Anxiety    Depression    Lung collapse 2015   left spontaneous pneumothorax    Migraine      Past Surgical History:  Procedure Laterality Date   CHEST TUBE INSERTION Left 2015   for spontaneous pneumothorax     Medications  Takes no meds routinely.     Allergies Allergies  Allergen Reactions   Adderall [Amphetamine-Dextroamphetamine]     Causes altered mental status, rage    Family History Family History  Problem Relation Age of Onset   Heart disease Mother    Seizures Mother    Heart disease Father    Breast cancer Maternal Grandmother     Review of Systems: Constitutional: no fevers or chills Eye:  no recent significant change in vision Ear/Nose/Mouth/Throat:  Ears:  no hearing loss Nose/Mouth/Throat:  no complaints of nasal congestion, no sore throat Cardiovascular:  no chest pain Respiratory:  no shortness of breath Gastrointestinal:  no abdominal pain, no change in bowel habits GU:  Male: negative for dysuria Musculoskeletal/Extremities:  no pain of the joints Integumentary (Skin/Breast):  no abnormal skin lesions reported Neurologic:  no headaches Endocrine: No unexpected weight changes Hematologic/Lymphatic:  no night sweats  Exam BP 112/68 (BP Location: Left Arm, Patient Position: Sitting, Cuff Size: Normal)   Pulse 84   Temp 98 F (36.7 C) (Oral)   Resp 16   Ht 5\' 8"  (1.727 m)   Wt 169 lb (76.7 kg)   SpO2 99%   BMI 25.70 kg/m   General:  well developed, well nourished, in no apparent distress Skin:  no significant moles, warts, or growths Head:  no masses, lesions, or tenderness Eyes:  pupils equal and round, sclera anicteric without injection Ears:  canals without lesions, TMs shiny without retraction, no obvious effusion, no erythema Nose:  nares patent, mucosa normal Throat/Pharynx:  lips and gingiva without lesion; tongue and uvula midline; non-inflamed pharynx; no exudates or postnasal drainage Neck: neck supple without adenopathy, thyromegaly, or masses Lungs:  clear to auscultation, breath sounds equal bilaterally, no respiratory distress Cardio:  regular rate and rhythm, no bruits, no LE edema Abdomen:  abdomen soft, nontender; bowel sounds normal; no masses or organomegaly Genital (male): Deferred Rectal: Deferred Musculoskeletal:  symmetrical muscle groups noted without atrophy or deformity Extremities:  no clubbing, cyanosis, or edema, no deformities, no skin discoloration Neuro:  gait normal; deep tendon reflexes normal and symmetric Psych: well oriented with normal range of affect and appropriate judgment/insight  Assessment and Plan  Well adult exam - Plan: CBC, Comprehensive metabolic panel, Lipid panel   Well 37 y.o. male. Counseled on diet and exercise. Self testicular exams recommended at least monthly.  Advanced directive form requested today.  Other orders as above. Follow up in 1 year pending the above workup. The patient voiced understanding and agreement to the plan.  Jilda Roche [place], DO 04/14/23 8:18 AM

## 2023-05-26 ENCOUNTER — Other Ambulatory Visit (INDEPENDENT_AMBULATORY_CARE_PROVIDER_SITE_OTHER): Payer: BC Managed Care – PPO

## 2023-05-26 DIAGNOSIS — R7309 Other abnormal glucose: Secondary | ICD-10-CM | POA: Diagnosis not present

## 2023-05-26 LAB — LIPID PANEL
Cholesterol: 184 mg/dL (ref 0–200)
HDL: 41.1 mg/dL (ref >39.00)
LDL Cholesterol: 122 mg/dL — ABNORMAL HIGH (ref 0–99)
NonHDL: 142.75
Total CHOL/HDL Ratio: 4
Triglycerides: 103 mg/dL (ref 0.0–149.0)
VLDL: 20.6 mg/dL (ref 0.0–40.0)

## 2023-05-26 LAB — HEMOGLOBIN A1C: Hgb A1c MFr Bld: 5.9 % (ref 4.6–6.5)

## 2023-07-29 DIAGNOSIS — L821 Other seborrheic keratosis: Secondary | ICD-10-CM | POA: Diagnosis not present

## 2023-07-29 DIAGNOSIS — D229 Melanocytic nevi, unspecified: Secondary | ICD-10-CM | POA: Diagnosis not present

## 2023-07-29 DIAGNOSIS — L814 Other melanin hyperpigmentation: Secondary | ICD-10-CM | POA: Diagnosis not present

## 2023-07-29 DIAGNOSIS — X32XXXS Exposure to sunlight, sequela: Secondary | ICD-10-CM | POA: Diagnosis not present

## 2023-10-24 DIAGNOSIS — M549 Dorsalgia, unspecified: Secondary | ICD-10-CM | POA: Diagnosis not present

## 2023-10-24 DIAGNOSIS — R0602 Shortness of breath: Secondary | ICD-10-CM | POA: Diagnosis not present

## 2023-10-24 DIAGNOSIS — M5134 Other intervertebral disc degeneration, thoracic region: Secondary | ICD-10-CM | POA: Diagnosis not present

## 2023-10-24 DIAGNOSIS — M419 Scoliosis, unspecified: Secondary | ICD-10-CM | POA: Diagnosis not present

## 2023-10-28 ENCOUNTER — Ambulatory Visit (INDEPENDENT_AMBULATORY_CARE_PROVIDER_SITE_OTHER): Admitting: Family Medicine

## 2023-10-28 ENCOUNTER — Encounter: Payer: Self-pay | Admitting: Family Medicine

## 2023-10-28 VITALS — BP 118/84 | HR 84 | Ht 68.0 in | Wt 173.0 lb

## 2023-10-28 DIAGNOSIS — M549 Dorsalgia, unspecified: Secondary | ICD-10-CM | POA: Diagnosis not present

## 2023-10-28 NOTE — Progress Notes (Signed)
 Musculoskeletal Exam  Patient: Sean Carroll DOB: [birth year]  DOS: 10/28/2023  SUBJECTIVE:  Chief Complaint:   Chief Complaint  Patient presents with   Back Injury    Sean Carroll is a 38 y.o.  male for evaluation and treatment of back pain.   Onset:  4 days ago. Bent over to pick something up.  Location: mid left Character:  sharp  Progression of issue:  has slightly improved Associated symptoms: no redness, bruising, swelling, fevers, weakness Treatment: to date has been oral steroids and muscle relaxers.   Neurovascular symptoms: no  Past Medical History:  Diagnosis Date   ADHD (attention deficit hyperactivity disorder)    Anxiety    Depression    Lung collapse 2015   left spontaneous pneumothorax    Migraine     Objective:  VITAL SIGNS: BP 118/84   Pulse 84   Ht 5\' 8"  (1.727 m)   Wt 173 lb (78.5 kg)   SpO2 96%   BMI 26.30 kg/m  Constitutional: Well formed, well developed. No acute distress. HENT: Normocephalic, atraumatic.  Thorax & Lungs:  No accessory muscle use Musculoskeletal: mid back.   Tenderness to palpation: yes over the L thor parasp msc Deformity: no Ecchymosis: no Neurologic: Normal sensory function. No focal deficits noted. DTR's equal and symmetric in LE's. No clonus. Psychiatric: Normal mood. Age appropriate judgment and insight. Alert & oriented x 3.    Assessment:  Acute mid back pain  Plan: Stretches/exercises, heat, ice, Tylenol , NSAIDs. F/u prn. The patient voiced understanding and agreement to the plan.   Shellie Dials [place], DO 10/28/23  12:35 PM

## 2023-10-28 NOTE — Patient Instructions (Signed)
Heat (pad or rice pillow in microwave) over affected area, 10-15 minutes twice daily.   Ice/cold pack over area for 10-15 min twice daily.  OK to take Tylenol 1000 mg (2 extra strength tabs) or 975 mg (3 regular strength tabs) every 6 hours as needed.  Ibuprofen 400-600 mg (2-3 over the counter strength tabs) every 6 hours as needed for pain.  Let us know if you need anything.  Mid-Back Strain Rehab It is normal to feel mild stretching, pulling, tightness, or discomfort as you do these exercises, but you should stop right away if you feel sudden pain or your pain gets worse.  Stretching and range of motion exercises This exercise warms up your muscles and joints and improves the movement and flexibility of your back and shoulders. This exercise also help to relieve pain. Exercise A: Chest and spine stretch  Lie down on your back on a firm surface. Roll a towel or a small blanket so it is about 4 inches (10 cm) in diameter. Put the towel lengthwise under the middle of your back so it is under your spine, but not under your shoulder blades. To increase the stretch, you may put your hands behind your head and let your elbows fall to your sides. Hold for 30 seconds. Repeat exercise 2 times. Complete this exercise 3 times per week. Strengthening exercises These exercises build strength and endurance in your back and your shoulder blade muscles. Endurance is the ability to use your muscles for a long time, even after they get tired. Exercise C: Straight arm rows (shoulder extension) Stand with your feet shoulder width apart. Secure an exercise band to a stable object in front of you so the band is at or above shoulder height. Hold one end of the exercise band in each hand. Straighten your elbows and lift your hands up to shoulder height. Step back, away from the secured end of the exercise band, until the band stretches. Squeeze your shoulder blades together and pull your hands down to the  sides of your thighs. Stop when your hands are straight down by your sides. Do not let your hands go behind your body. Hold for 2 seconds. Slowly return to the starting position. Repeat 2 times. Complete this exercise 3 times per week. Exercise D: Shoulder external rotation, prone Lie on your abdomen on a firm bed so your left / right forearm hangs over the edge of the bed and your upper arm is on the bed, straight out from your body. Your elbow should be bent. Your palm should be facing your feet. If instructed, hold a 2-5 lb weight in your hand. Squeeze your shoulder blade toward the middle of your back. Do not let your shoulder lift toward your ear. Keep your elbow bent in an "L" shape (90 degrees) while you slowly move your forearm up toward the ceiling. Move your forearm up to the height of the bed, toward your head. Your upper arm should not move. At the top of the movement, your palm should face the floor. Hold for 1 second. Slowly return to the starting position and relax your muscles. Repeat 2 times. Complete this exercise 3 times per week. Exercise E: Scapular retraction and external rotation, rowing  Sit in a stable chair without armrests, or stand. Secure an exercise band to a stable object in front of you so it is at shoulder height. Hold one end of the exercise band in each hand. Bring your arms out straight in  front of you. Step back, away from the secured end of the exercise band, until the band stretches. Pull the band backward. As you do this, bend your elbows and squeeze your shoulder blades together, but avoid letting the rest of your body move. Do not let your shoulders lift up toward your ears. Stop when your elbows are at your sides or slightly behind your body. Hold for 1 second1. Slowly straighten your arms to return to the starting position. Repeat 2 times. Complete this exercise 3 times per week. Posture and body mechanics  Body mechanics refers to the  movements and positions of your body while you do your daily activities. Posture is part of body mechanics. Good posture and healthy body mechanics can help to relieve stress in your body's tissues and joints. Good posture means that your spine is in its natural S-curve position (your spine is neutral), your shoulders are pulled back slightly, and your head is not tipped forward. The following are general guidelines for applying improved posture and body mechanics to your everyday activities. Standing  When standing, keep your spine neutral and your feet about hip-width apart. Keep a slight bend in your knees. Your ears, shoulders, and hips should line up. When you do a task in which you lean forward while standing in one place for a long time, place one foot up on a stable object that is 2-4 inches (5-10 cm) high, such as a footstool. This helps keep your spine neutral. Sitting  When sitting, keep your spine neutral and keep your feet flat on the floor. Use a footrest, if necessary, and keep your thighs parallel to the floor. Avoid rounding your shoulders, and avoid tilting your head forward. When working at a desk or a computer, keep your desk at a height where your hands are slightly lower than your elbows. Slide your chair under your desk so you are close enough to maintain good posture. When working at a computer, place your monitor at a height where you are looking straight ahead and you do not have to tilt your head forward or downward to look at the screen. Resting  When lying down and resting, avoid positions that are most painful for you. If you have pain with activities such as sitting, bending, stooping, or squatting (flexion-based activities), lie in a position in which your body does not bend very much. For example, avoid curling up on your side with your arms and knees near your chest (fetal position). If you have pain with activities such as standing for a long time or reaching with  your arms (extension-based activities), lie with your spine in a neutral position and bend your knees slightly. Try the following positions: Lying on your side with a pillow between your knees. Lying on your back with a pillow under your knees.  Lifting  When lifting objects, keep your feet at least shoulder-width apart and tighten your abdominal muscles. Bend your knees and hips and keep your spine neutral. It is important to lift using the strength of your legs, not your back. Do not lock your knees straight out. Always ask for help to lift heavy or awkward objects. Make sure you discuss any questions you have with your health care provider. Document Released: 05/27/2005 Document Revised: 02/01/2016 Document Reviewed: 03/08/2015 Elsevier Interactive Patient Education  [employer].

## 2023-12-01 ENCOUNTER — Encounter: Payer: Self-pay | Admitting: Family Medicine

## 2024-04-19 ENCOUNTER — Ambulatory Visit: Payer: Self-pay | Admitting: Family Medicine

## 2024-04-19 ENCOUNTER — Encounter: Payer: Self-pay | Admitting: Family Medicine

## 2024-04-19 ENCOUNTER — Ambulatory Visit: Payer: BC Managed Care – PPO | Admitting: Family Medicine

## 2024-04-19 VITALS — BP 120/78 | HR 100 | Temp 98.0°F | Resp 16 | Ht 68.0 in | Wt 168.6 lb

## 2024-04-19 DIAGNOSIS — Z0000 Encounter for general adult medical examination without abnormal findings: Secondary | ICD-10-CM

## 2024-04-19 LAB — CBC
HCT: 42.6 % (ref 39.0–52.0)
Hemoglobin: 14.4 g/dL (ref 13.0–17.0)
MCHC: 33.8 g/dL (ref 30.0–36.0)
MCV: 86 fl (ref 78.0–100.0)
Platelets: 355 K/uL (ref 150.0–400.0)
RBC: 4.95 Mil/uL (ref 4.22–5.81)
RDW: 12.8 % (ref 11.5–15.5)
WBC: 8.6 K/uL (ref 4.0–10.5)

## 2024-04-19 LAB — COMPREHENSIVE METABOLIC PANEL WITH GFR
ALT: 26 U/L (ref 0–53)
AST: 23 U/L (ref 0–37)
Albumin: 4.8 g/dL (ref 3.5–5.2)
Alkaline Phosphatase: 36 U/L — ABNORMAL LOW (ref 39–117)
BUN: 19 mg/dL (ref 6–23)
CO2: 22 meq/L (ref 19–32)
Calcium: 9.3 mg/dL (ref 8.4–10.5)
Chloride: 107 meq/L (ref 96–112)
Creatinine, Ser: 0.98 mg/dL (ref 0.40–1.50)
GFR: 96.35 mL/min (ref >60.00)
Glucose, Bld: 84 mg/dL (ref 70–99)
Potassium: 4.2 meq/L (ref 3.5–5.1)
Sodium: 140 meq/L (ref 135–145)
Total Bilirubin: 0.9 mg/dL (ref 0.2–1.2)
Total Protein: 7.1 g/dL (ref 6.0–8.3)

## 2024-04-19 LAB — LIPID PANEL
Cholesterol: 205 mg/dL — ABNORMAL HIGH (ref 0–200)
HDL: 39.6 mg/dL (ref >39.00)
LDL Cholesterol: 131 mg/dL — ABNORMAL HIGH (ref 0–99)
NonHDL: 165.89
Total CHOL/HDL Ratio: 5
Triglycerides: 174 mg/dL — ABNORMAL HIGH (ref 0.0–149.0)
VLDL: 34.8 mg/dL (ref 0.0–40.0)

## 2024-04-19 NOTE — Patient Instructions (Addendum)
 Give us  2-3 business days to get the results of your labs back.   Keep the diet clean and stay active.  Stay hydrated.  Take Metamucil or Benefiber daily.  Ice/cold pack over area for 10-15 min twice daily.  Heat (pad or rice pillow in microwave) over affected area, 10-15 minutes twice daily.   Flonase  (fluticasone ); nasal spray that is over the counter. 2 sprays each nostril, once daily. Aim towards the same side eye when you spray.  Please consider counseling. Contact [phone] to schedule an appointment or inquire about cost/insurance coverage.  [facility] located at [address], [place], [place].  Phone number = [phone].  Dr. Verdel Silk - Adult Psychiatry.    [facility] located at [address] [place], [place], [place]. Phone number = [phone].   [facility] located at [address], [place], [place].  Phone number = [phone].   [facility] located at [address] [place], [place], [place].  Phone number = [phone].  Let us  know if you need anything.  Plantar Fasciitis Stretches/exercises Do exercises exactly as told by your health care provider and adjust them as directed. It is normal to feel mild stretching, pulling, tightness, or discomfort as you do these exercises, but you should stop right away if you feel sudden pain or your pain gets worse.   Stretching and range of motion exercises These exercises warm up your muscles and joints and improve the movement and flexibility of your foot. These exercises also help to relieve pain.  Exercise A: Plantar fascia stretch Sit with your left / right leg crossed over your opposite knee. Hold your heel with one hand with that thumb near your arch. With your other hand, hold your toes and gently pull them back toward the top of your foot. You should feel a stretch on the bottom of your toes or your foot or both. Hold this stretch for 30  seconds. Slowly release your toes and return to the starting position. Repeat 2 times. Complete this exercise 3 times per week.  Exercise B: Gastroc, standing Stand with your hands against a wall. Extend your left / right leg behind you, and bend your front knee slightly. Keeping your heels on the floor and keeping your back knee straight, shift your weight toward the wall without arching your back. You should feel a gentle stretch in your left / right calf. Hold this position for 30 seconds. Repeat 2 times. Complete this exercise 3 times a week. Exercise C: Soleus, standing Stand with your hands against a wall. Extend your left / right leg behind you, and bend your front knee slightly. Keeping your heels on the floor, bend your back knee and slightly shift your weight over the back leg. You should feel a gentle stretch deep in your calf. Hold this position for 30 seconds. Repeat 2 times. Complete this exercise 3 times per week. Exercise D: Gastrocsoleus, standing Stand with the ball of your left / right foot on a step. The ball of your foot is on the walking surface, right under your toes. Keep your other foot firmly on the same step. Hold onto the wall or a railing for balance. Slowly lift your other foot, allowing your body weight to press your heel down over the edge of the step. You should feel a stretch in your left / right calf. Hold this position for 30 seconds. Return both feet to the step. Repeat this exercise with  a slight bend in your left / right knee. Repeat 2 times with your left / right knee straight and 2times with your left / right knee bent. Complete this exercise 3 times a week.  Balance exercise This exercise builds your balance and strength control of your arch to help take pressure off your plantar fascia. Exercise E: Single leg stand Without shoes, stand near a railing or in a doorway. You may hold onto the railing or door frame as needed. Stand on your left /  right foot. Keep your big toe down on the floor and try to keep your arch lifted. Do not let your foot roll inward. Hold this position for 30 seconds. If this exercise is too easy, you can try it with your eyes closed or while standing on a pillow. Repeat 2 times. Complete this exercise 3 times per week. This information is not intended to replace advice given to you by your health care provider. Make sure you discuss any questions you have with your health care provider. Document Released: 05/27/2005 Document Revised: 01/30/2016 Document Reviewed: 04/10/2015 Elsevier Interactive Patient Education  2017 [employer].

## 2024-04-19 NOTE — Progress Notes (Signed)
 Chief Complaint  Patient presents with   Annual Exam    CPE    Well Male Sean Carroll is here for a complete physical.   His last physical was >1 year ago.  Current diet: in general, a healthy diet.   Current exercise: strength training Weight trend: stable Fatigue out of ordinary? No. Seat belt? Yes.   Advanced directive? Yes  Health maintenance Tetanus- Yes HIV- Yes Hep C- Yes  Past Medical History:  Diagnosis Date   ADHD (attention deficit hyperactivity disorder)    Anxiety    Depression    Lung collapse 2015   left spontaneous pneumothorax    Migraine      Past Surgical History:  Procedure Laterality Date   CHEST TUBE INSERTION Left 2015   for spontaneous pneumothorax     Medications  Takes no meds routinely.     Allergies Allergies  Allergen Reactions   Adderall [Amphetamine-Dextroamphetamine]     Causes altered mental status, rage    Family History Family History  Problem Relation Age of Onset   Heart disease Mother    Seizures Mother    Heart disease Father    Breast cancer Maternal Grandmother     Review of Systems: Constitutional: no fevers or chills Eye:  no recent significant change in vision Ear/Nose/Mouth/Throat:  Ears:  no hearing loss Nose/Mouth/Throat:  no complaints of nasal congestion, no sore throat Cardiovascular:  no chest pain Respiratory:  no shortness of breath Gastrointestinal:  no abdominal pain, no change in bowel habits GU:  Male: negative for dysuria, frequency, and incontinence Musculoskeletal/Extremities:  +L foot pain; otherwise no pain of the joints Integumentary (Skin/Breast):  no abnormal skin lesions reported Neurologic:  no headaches Endocrine: No unexpected weight changes Hematologic/Lymphatic:  no night sweats  Exam BP 120/78 (BP Location: Left Arm, Patient Position: Sitting)   Pulse 100   Temp 98 F (36.7 C) (Oral)   Resp 16   Ht 5' 8 (1.727 m)   Wt 168 lb 9.6 oz (76.5 kg)   SpO2 98%    BMI 25.64 kg/m  General:  well developed, well nourished, in no apparent distress Skin:  no significant moles, warts, or growths Head:  no masses, lesions, or tenderness Eyes:  pupils equal and round, sclera anicteric without injection Ears:  canals without lesions, TMs shiny without retraction, no obvious effusion, no erythema Nose:  nares patent, mucosa normal Throat/Pharynx:  lips and gingiva without lesion; tongue and uvula midline; non-inflamed pharynx; no exudates or postnasal drainage Neck: neck supple without adenopathy, thyromegaly, or masses Lungs:  clear to auscultation, breath sounds equal bilaterally, no respiratory distress Cardio:  regular rate and rhythm, no bruits, no LE edema Abdomen:  abdomen soft, nontender; bowel sounds normal; no masses or organomegaly Rectal: Deferred Musculoskeletal:  symmetrical muscle groups noted without atrophy or deformity Extremities:  no clubbing, cyanosis, or edema, no deformities, no skin discoloration Neuro:  gait normal; deep tendon reflexes normal and symmetric Psych: well oriented with normal range of affect and appropriate judgment/insight  Assessment and Plan  Well adult exam - Plan: CBC, Comprehensive metabolic panel with GFR, Lipid panel   Well 38 y.o. male. Counseled on diet and exercise. Having some stress, counseling info provided.  Consider adding fiber supp to diet. Other orders as above. Follow up in 1 yr pending the above workup. The patient voiced understanding and agreement to the plan.  Mabel Mt Martell, DO 04/19/24 10:36 AM

## 2024-06-30 ENCOUNTER — Ambulatory Visit: Admitting: Medical

## 2024-06-30 ENCOUNTER — Ambulatory Visit: Payer: Self-pay

## 2024-06-30 VITALS — BP 116/82 | HR 100 | Temp 98.1°F | Resp 14 | Ht 68.0 in | Wt 177.6 lb

## 2024-06-30 DIAGNOSIS — J3489 Other specified disorders of nose and nasal sinuses: Secondary | ICD-10-CM

## 2024-06-30 DIAGNOSIS — M791 Myalgia, unspecified site: Secondary | ICD-10-CM | POA: Diagnosis not present

## 2024-06-30 DIAGNOSIS — J029 Acute pharyngitis, unspecified: Secondary | ICD-10-CM | POA: Diagnosis not present

## 2024-06-30 DIAGNOSIS — R051 Acute cough: Secondary | ICD-10-CM | POA: Diagnosis not present

## 2024-06-30 LAB — POC COVID19/FLU A&B COMBO
Covid Antigen, POC: NEGATIVE
Influenza A Antigen, POC: NEGATIVE
Influenza B Antigen, POC: NEGATIVE

## 2024-06-30 LAB — POCT RAPID STREP A (OFFICE): Rapid Strep A Screen: POSITIVE — AB

## 2024-06-30 MED ORDER — BENZONATATE 100 MG PO CAPS: 100.0000 mg | ORAL_CAPSULE | Freq: Three times a day (TID) | ORAL | 0 refills | Status: AC | PRN

## 2024-06-30 MED ORDER — AMOXICILLIN-POT CLAVULANATE 875-125 MG PO TABS: 1.0000 | ORAL_TABLET | Freq: Two times a day (BID) | ORAL | 0 refills | Status: AC

## 2024-06-30 NOTE — Patient Instructions (Signed)
 Acute streptococcal pharyngitis Positive strep test. Contagious. - Prescribed Augmentin  (amoxicillin /clavulanic acid) twice daily for 10 days. - Advised replacing toothbrush after 48 hours of antibiotic treatment. - Provided a doctor's note.  Acute otitis media Bright red left ear with pressure. Augmentin  effective for ear infections. - Prescribed Augmentin  (amoxicillin /clavulanic acid) twice daily for 10 days.  Sinus pressure Sinus pressure and congestion. Augmentin  provides sinus coverage. - Prescribed Augmentin  (amoxicillin /clavulanic acid) twice daily for 10 days.  Acute cough (lungs clear) Cough present, not typical for strep. - Prescribed benzonatate  for cough relief.  Myalgia Body aches consistent with strep infection. - Recommended Tylenol  325-500 mg and ibuprofen up to 800 mg for pain relief.  Follow up as needed as discussed worsening or persisting symptoms

## 2024-06-30 NOTE — Progress Notes (Signed)
" ° °  Subjective:    Patient ID: Sean Carroll, male    DOB: 13-May-1986, 39 y.o.   MRN: [id number]  HPI  Sean Carroll is a 39 year old male who presents with symptoms of strep throat.  He became ill last night with fatigue, sore throat, mild cough, and today has had worsening severe headache, low energy, sore throat, nasal congestion, and sinus pressure. Rapid testing showed positive strep and negative flu and COVID. He notes bothersome cough and marked fatigue but no ear pain.     Review of Systems See hpi    Objective:   Physical Exam  General- No acute distress. Pleasant patient. Neck- Full range of motion, no jvd Lungs- Clear, even and unlabored. Heart- regular rate and rhythm. Neurologic- CNII- XII grossly intact.  Heent- bright red posterior pharynx. Mild symmetric tonsil hypertrophy. No exudate. Left tm bright red. Mid maxillary sinus pressure.      Assessment & Plan:  Acute streptococcal pharyngitis Positive strep test. Contagious. - Prescribed Augmentin  (amoxicillin /clavulanic acid) twice daily for 10 days. - Advised replacing toothbrush after 48 hours of antibiotic treatment. - Provided a doctor's note.  Acute otitis media Bright red left ear with pressure. Augmentin  effective for ear infections. - Prescribed Augmentin  (amoxicillin /clavulanic acid) twice daily for 10 days.  Sinus pressure Sinus pressure and congestion. Augmentin  provides sinus coverage. - Prescribed Augmentin  (amoxicillin /clavulanic acid) twice daily for 10 days.  Acute cough (lungs clear) Cough present, not typical for strep. - Prescribed benzonatate  for cough relief.  Myalgia Body aches consistent with strep infection. - Recommended Tylenol  325-500 mg and ibuprofen up to 800 mg for pain relief.  Follow up as needed as discussed worsening or persisting symptoms   Dallas Maxwell, PA-C  "

## 2024-06-30 NOTE — Telephone Encounter (Signed)
 FYI Only or Action Required?: FYI only for provider: appointment scheduled on 06/30/24.  Patient was last seen in primary care on 04/19/2024 by Frann Mabel Mt, DO.  Called Nurse Triage reporting Generalized Body Aches.  Symptoms began yesterday.  Interventions attempted: OTC medications: dayquil; tylenol .  Symptoms are: unchanged.  Triage Disposition: See PCP When Office is Open (Within 3 Days)  Patient/caregiver understands and will follow disposition?: Yes     Message from Alexandria E sent at 06/30/2024  2:39 PM EST  Summary: Potential Flu   Reason for Triage: Headache, body aches, throat pain, and fever. Symptoms going on for the past 24-48 hours.         Reason for Disposition  [1] MODERATE pain (e.g., interferes with normal activities) AND [2] present > 3 days  Answer Assessment - Initial Assessment Questions Pt's wife, Reche, contacted clinic reporting pt has had cold vs. Flu symptoms x 24-48h. Pt has been taking dayquil today and tylenol  PRN. Pt has no known exposure to flu and only one in household with symptoms. Pt has been outside working but has been under heat lamp. Pt's wife states she would like pt to be seen today, prefers Dr. Frann but agreeable to visit for testing. Discussed tamiflu window and pt agreeable, per wife who called on separate line. Appointment scheduled for evaluation. Patient agrees with plan of care, and will call back if anything changes, or if symptoms worsen.      1. ONSET: When did the muscle aches or body pains start?      24-48h ago with cold/flu symptoms   2. LOCATION: What part of your body is hurting? (e.g., entire body, arms, legs)      Entire body   3. SEVERITY: How bad is the pain? (Scale 1-10; or mild, moderate, severe)     Mild to moderate; pt's wife is caller   4. CAUSE: What do you think is causing the pains?     Pt suspects cold vs. Flu   5. FEVER: Do you have a fever? If Yes, ask: What is your  temperature, how was it measured, and  when did it start?      Unknown; pt has not taken temp but having fever like symptoms   6. OTHER SYMPTOMS: Do you have any other symptoms? (e.g., chest pain, cold or flu symptoms, rash, weakness, weight loss)     H/a, body aches, sore throat, fever symptoms  Protocols used: Muscle Aches and Body Pain-A-AH

## 2024-06-30 NOTE — Telephone Encounter (Signed)
 Appt scheduled

## 2024-07-07 NOTE — Telephone Encounter (Signed)
 Called pt wife and got confirmation on what was needed on the note. Advised her that I will work on it now and the note will be in [place] under letters

## 2024-07-07 NOTE — Telephone Encounter (Unsigned)
 Copied from CRM #[id number]. Topic: General - Other >> Jul 07, 2024  8:26 AM Jeoffrey H wrote: Reason for CRM: Reche stated the patient was seen last week for a sick appointment 01/21 and he received a work note for 2 days but needed it for 3 days. Note is needing to be revised. Please contact patient. Can send it through email at [email or web address] or upload it to Mychart portal.   Reche- [phone] (on DPR)
